# Patient Record
Sex: Female | Born: 1950 | Race: Black or African American | Hispanic: No | Marital: Single | Smoking: Current every day smoker
Health system: Southern US, Community
[De-identification: ages and names within clinical notes are randomized; demographics above are authoritative.]

## PROBLEM LIST (undated history)

## (undated) DIAGNOSIS — Z1630 Resistance to unspecified antimicrobial drugs: Secondary | ICD-10-CM

## (undated) DIAGNOSIS — T148XXA Other injury of unspecified body region, initial encounter: Secondary | ICD-10-CM

## (undated) DIAGNOSIS — F1210 Cannabis abuse, uncomplicated: Secondary | ICD-10-CM

## (undated) DIAGNOSIS — F1410 Cocaine abuse, uncomplicated: Secondary | ICD-10-CM

## (undated) DIAGNOSIS — I10 Essential (primary) hypertension: Secondary | ICD-10-CM

## (undated) DIAGNOSIS — I2510 Atherosclerotic heart disease of native coronary artery without angina pectoris: Secondary | ICD-10-CM

## (undated) DIAGNOSIS — I214 Non-ST elevation (NSTEMI) myocardial infarction: Secondary | ICD-10-CM

## (undated) HISTORY — DX: Atherosclerotic heart disease of native coronary artery without angina pectoris: I25.10

## (undated) HISTORY — DX: Resistance to unspecified antimicrobial drugs: Z16.30

## (undated) HISTORY — DX: Essential (primary) hypertension: I10

## (undated) HISTORY — DX: Other injury of unspecified body region, initial encounter: T14.8XXA

---

## 2011-06-19 DIAGNOSIS — I214 Non-ST elevation (NSTEMI) myocardial infarction: Secondary | ICD-10-CM

## 2011-06-19 HISTORY — DX: Non-ST elevation (NSTEMI) myocardial infarction: I21.4

## 2011-07-14 ENCOUNTER — Inpatient Hospital Stay (HOSPITAL_COMMUNITY)
Admission: AD | Admit: 2011-07-14 | Discharge: 2011-07-17 | DRG: 249 | Disposition: A | Discharge status: Home / Self Care | Payer: Medicaid - Out of State | Source: Other Acute Inpatient Hospital | Attending: Cardiology | Admitting: Cardiology

## 2011-07-14 ENCOUNTER — Encounter (HOSPITAL_COMMUNITY): Payer: Self-pay | Admitting: Nurse Practitioner

## 2011-07-14 DIAGNOSIS — F17200 Nicotine dependence, unspecified, uncomplicated: Secondary | ICD-10-CM | POA: Diagnosis present

## 2011-07-14 DIAGNOSIS — Z79899 Other long term (current) drug therapy: Secondary | ICD-10-CM

## 2011-07-14 DIAGNOSIS — I2510 Atherosclerotic heart disease of native coronary artery without angina pectoris: Secondary | ICD-10-CM | POA: Diagnosis present

## 2011-07-14 DIAGNOSIS — F1410 Cocaine abuse, uncomplicated: Secondary | ICD-10-CM | POA: Insufficient documentation

## 2011-07-14 DIAGNOSIS — Z833 Family history of diabetes mellitus: Secondary | ICD-10-CM

## 2011-07-14 DIAGNOSIS — Z7982 Long term (current) use of aspirin: Secondary | ICD-10-CM

## 2011-07-14 DIAGNOSIS — I10 Essential (primary) hypertension: Secondary | ICD-10-CM | POA: Diagnosis present

## 2011-07-14 DIAGNOSIS — F1210 Cannabis abuse, uncomplicated: Secondary | ICD-10-CM | POA: Insufficient documentation

## 2011-07-14 DIAGNOSIS — I214 Non-ST elevation (NSTEMI) myocardial infarction: Secondary | ICD-10-CM

## 2011-07-14 DIAGNOSIS — Z8249 Family history of ischemic heart disease and other diseases of the circulatory system: Secondary | ICD-10-CM

## 2011-07-14 DIAGNOSIS — Z7902 Long term (current) use of antithrombotics/antiplatelets: Secondary | ICD-10-CM

## 2011-07-14 HISTORY — DX: Non-ST elevation (NSTEMI) myocardial infarction: I21.4

## 2011-07-14 HISTORY — DX: Cocaine abuse, uncomplicated: F14.10

## 2011-07-14 HISTORY — DX: Cannabis abuse, uncomplicated: F12.10

## 2011-07-14 LAB — CARDIAC PANEL(CRET KIN+CKTOT+MB+TROPI)
CK, MB: 10.5 ng/mL (ref 0.3–4.0)
Troponin I: 2.61 ng/mL (ref <0.30)

## 2011-07-14 LAB — MRSA PCR SCREENING: MRSA by PCR: NEGATIVE

## 2011-07-14 MED ORDER — ALPRAZOLAM 0.25 MG PO TABS
0.2500 mg | ORAL_TABLET | Freq: Two times a day (BID) | ORAL | Status: DC | PRN
  Administered 2011-07-15 – 2011-07-16 (×2): 0.25 mg via ORAL
  Filled 2011-07-14 (×3): qty 1

## 2011-07-14 MED ORDER — NITROGLYCERIN IN D5W 200-5 MCG/ML-% IV SOLN
3.0000 ug/min | INTRAVENOUS | Status: DC
  Administered 2011-07-14: 30 ug/min via INTRAVENOUS
  Administered 2011-07-16: 5 ug/min via INTRAVENOUS
  Filled 2011-07-14: qty 250

## 2011-07-14 MED ORDER — HEPARIN SOD (PORCINE) IN D5W 100 UNIT/ML IV SOLN
950.0000 [IU]/h | INTRAVENOUS | Status: DC
  Administered 2011-07-14: 650 [IU]/h via INTRAVENOUS
  Administered 2011-07-15: 950 [IU]/h via INTRAVENOUS
  Filled 2011-07-14 (×3): qty 250

## 2011-07-14 MED ORDER — SODIUM CHLORIDE 0.9 % IV SOLN: 250.0000 mL | INTRAVENOUS | Status: DC | PRN

## 2011-07-14 MED ORDER — HEPARIN BOLUS VIA INFUSION
3000.0000 [IU] | Freq: Once | INTRAVENOUS | Status: AC
  Administered 2011-07-14: 3000 [IU] via INTRAVENOUS
  Filled 2011-07-14: qty 3000

## 2011-07-14 MED ORDER — SODIUM CHLORIDE 0.9 % IJ SOLN
3.0000 mL | Freq: Two times a day (BID) | INTRAMUSCULAR | Status: DC
  Administered 2011-07-14: 3 mL via INTRAVENOUS

## 2011-07-14 MED ORDER — ONDANSETRON HCL 4 MG/2ML IJ SOLN: 4.0000 mg | Freq: Four times a day (QID) | INTRAMUSCULAR | Status: DC | PRN

## 2011-07-14 MED ORDER — ROSUVASTATIN CALCIUM 40 MG PO TABS
40.0000 mg | ORAL_TABLET | Freq: Every day | ORAL | Status: DC
  Administered 2011-07-14 – 2011-07-16 (×3): 40 mg via ORAL
  Filled 2011-07-14 (×4): qty 1

## 2011-07-14 MED ORDER — ACETAMINOPHEN 325 MG PO TABS
650.0000 mg | ORAL_TABLET | ORAL | Status: DC | PRN
  Administered 2011-07-15 – 2011-07-17 (×3): 650 mg via ORAL
  Filled 2011-07-14 (×3): qty 2

## 2011-07-14 MED ORDER — NITROGLYCERIN 0.4 MG SL SUBL
0.4000 mg | SUBLINGUAL_TABLET | SUBLINGUAL | Status: DC | PRN
  Administered 2011-07-16 (×2): 0.4 mg via SUBLINGUAL
  Filled 2011-07-14: qty 25

## 2011-07-14 MED ORDER — ZOLPIDEM TARTRATE 5 MG PO TABS: 10.0000 mg | ORAL_TABLET | Freq: Every evening | ORAL | Status: DC | PRN

## 2011-07-14 MED ORDER — SODIUM CHLORIDE 0.9 % IJ SOLN: 3.0000 mL | INTRAMUSCULAR | Status: DC | PRN

## 2011-07-14 MED ORDER — LOSARTAN POTASSIUM 50 MG PO TABS
50.0000 mg | ORAL_TABLET | Freq: Every day | ORAL | Status: DC
  Administered 2011-07-14 – 2011-07-17 (×4): 50 mg via ORAL
  Filled 2011-07-14 (×4): qty 1

## 2011-07-14 MED ORDER — ASPIRIN EC 81 MG PO TBEC
81.0000 mg | DELAYED_RELEASE_TABLET | Freq: Every day | ORAL | Status: DC
  Administered 2011-07-15: 81 mg via ORAL
  Filled 2011-07-14: qty 1

## 2011-07-14 NOTE — H&P (Signed)
Patient ID: Melissa Mccall MRN: [id number], DOB/AGE: August 29, 1950   Admit date: 07/14/2011   Primary Physician: None Primary Cardiologist: New - will f/u in [place]  Pt. Profile:   A 61 year old female with prior history of untreated hypertension as well as polysubstance abuse who presents on transfer from [facility] hospital secondary to non-ST elevation MI.  Problem List: Past Medical History  Diagnosis Date  . NSTEMI (non-ST elevated myocardial infarction)     a.  07/14/2011  . HTN (hypertension)     a. untreated  . Cocaine abuse   . Tobacco abuse     a. 40 pack years currently smoking 1ppd  . Marijuana abuse     Past Surgical History  Procedure Date  . Cesarean section     x 2     Allergies: No Known Allergies  HPI:   61 year old female with the above problem list. She reports being diagnosed previously with hypertension as well as thyroid disease she's never been on medications for any prolonged period of time. In fact, she says she refused workup of her thyroid. As per social history below, she smokes one pack per day and frequently uses marijuana and cocaine. Although initially saying she is cocaine 1 week ago with further questioning she admitted it was at least in the past 48 hours.  Possibly 4 days ago patient began experiencing intermittent left-sided and substernal chest pressure and tightness occurring at rest associated with diaphoresis. Symptoms would occur every 10 minutes or so lasting 10 minutes and resolving spontaneously but recurring frequently. Despite these symptoms she's been able to sleep the past few nights. Further, symptoms did not prevent her from using additional cocaine. This morning, patient awoke at approximately 5 AM secondary to recurrent chest discomfort. This time pain lasted longer than usual and a friend drove her to [facility]. There, she was noted to be markedly hypertensive with pressures in the 200s and her ECG reflected LVH with  repolarization abnormalities. Troponin was evaluated and found to be elevated 0.71 with a CK-MB of 8. Urine drug screen was positive for benzodiazepines, marijuana, and cocaine. She was transferred to [facility] cone for the evaluation. She's currently stable without chest pain and being maintained on IV nitroglycerin. Blood pressure is now in the 130s to 160s.   Home Medications - *No home meds*  Currently ordered Hospital Meds  Medication Dose Route Frequency Provider Last Rate Last Dose  . 0.9 %  sodium chloride infusion  250 mL Intravenous PRN Ok Anis, NP      . acetaminophen (TYLENOL) tablet 650 mg  650 mg Oral Q4H PRN Ok Anis, NP      . ALPRAZolam Prudy Feeler) tablet 0.25 mg  0.25 mg Oral BID PRN Ok Anis, NP      . aspirin EC tablet 81 mg  81 mg Oral Daily Ok Anis, NP      . losartan (COZAAR) tablet 50 mg  50 mg Oral Daily Ok Anis, NP      . nitroGLYCERIN (NITROSTAT) SL tablet 0.4 mg  0.4 mg Sublingual Q5 min PRN Ok Anis, NP      . nitroGLYCERIN 0.2 mg/mL in dextrose 5 % infusion  3-30 mcg/min Intravenous Titrated Ok Anis, NP      . ondansetron [facility]) injection 4 mg  4 mg Intravenous Q6H PRN Ok Anis, NP      . rosuvastatin (CRESTOR) tablet 40 mg  40 mg Oral q1800 Ok Anis,  NP      . sodium chloride 0.9 % injection 3 mL  3 mL Intravenous Q12H Ok Anis, NP      . sodium chloride 0.9 % injection 3 mL  3 mL Intravenous PRN Ok Anis, NP      . zolpidem (AMBIEN) tablet 10 mg  10 mg Oral QHS PRN Ok Anis, NP        Family History  Problem Relation Age of Onset  . Hypertension Mother     died in 35's  . Diabetes Mother   . Hypertension Father     died in 16's  . Colon cancer Brother     died in 21's   History   Social History  . Marital Status: Single    Spouse Name: N/A    Number of Children: N/A  . Years of Education: N/A   Occupational  History  . Not on file.   Social History Main Topics  . Smoking status: Current Everyday Smoker -- 1.0 packs/day for 40 years    Types: Cigarettes  . Smokeless tobacco: Never Used  . Alcohol Use: Yes     40 oz beer/day  . Drug Use: Yes     marijuana at least weekly (last used 1 wk ago); says she's tried cocaine and has used several times in the last week - last ~ 2 days ago  . Sexually Active: Yes   Other Topics Concern  . Not on file   Social History Narrative   Lives in [place].  Doesn't work or exercise.       Review of Systems: General: negative for chills, fever,or weight changes. + diaph in setting of c/p Cardiovascular: +++ for chest pain as outlined above.  No dyspnea on exertion, edema, orthopnea, palpitations, paroxysmal nocturnal dyspnea. Dermatological: negative for rash Respiratory: negative for cough or wheezing Urologic: negative for hematuria Abdominal: negative for nausea, vomiting, diarrhea, bright red blood per rectum, melena, or hematemesis Neurologic: negative for visual changes, syncope, or dizziness All other systems reviewed and are otherwise negative except as noted above.  Physical Exam: Blood pressure 160/94, pulse 79, temperature 98.5 F (36.9 C), temperature source Oral, height 5\' 4"  (1.626 m), weight 120 lb 5.9 oz (54.6 kg), SpO2 100.00%.  General: Well developed, well nourished, in no acute distress. Head: Normocephalic, atraumatic, sclera non-icteric, no xanthomas, nares are without discharge.  Neck: Supple without bruits or JVD. Lungs:  Resp regular and unlabored, CTA. Heart: RRR no s3, s4, or murmurs. Abdomen: Soft, non-tender, non-distended, BS + x 4.  Msk:  Strength and tone appears normal for age. Extremities: No clubbing, cyanosis or edema. DP/PT/Radials 2+ and equal bilaterally. Neuro: Alert and oriented X 3. Moves all extremities spontaneously. Psych: Normal affect.   Labs:  Labs from [facility] on paper chart.   Radiology/Studies:  No results found.  EKG:  RSR, 81, biatrial enlargement, lvh w/ antlat twi.  ASSESSMENT AND PLAN:   1. Non-ST segment elevation myocardial infarction:  Patient presents with elevated cardiac enzymes in the setting of 4 days of intermittent chest discomfort and cocaine abuse. We'll continue to cycle cardiac markers. Will add aspirin and statin therapy along with heparin and IV nitroglycerin. We will avoid beta blockers in the setting of ongoing cocaine abuse. Plan diagnostic catheterization on Monday or sooner should she have recurrent or recalcitrant chest pain.  2. Hypertensive urgency:  Patient's blood pressure was markedly elevated on presentation to [facility]. With IV nitroglycerin this is much  improved. We'll continue his IV nitroglycerin in the setting of above and given significant LVH on ECG will also add an ARB. We are choosing an ARB as patient reports that she was previously on antihypertensive that caused a cough. We'll check a 2-D echocardiogram to evaluate LV function and LVH.  3. Polysubstance abuse:  The patient has a long history of polysubstance abuse including tobacco marijuana and cocaine. We did discuss station however she will require a more formal counseling. Also ask social work to see regarding her cocaine and marijuana use.  4.? History of thyroid disease: Check TSH.  Signed, Nicolasa Ducking, NP 07/14/2011, 5:20 PM  Patient seen with NP.  Agree with all aspects of history, physical, and plan.  Will most likely plan cath Monday.  Control BP.  Needs to stop substance abuse.  Khai Torbert [employer]

## 2011-07-14 NOTE — Progress Notes (Addendum)
ANTICOAGULATION CONSULT NOTE - Initial Consult  Pharmacy Consult:  Heparin Indication:  ACS/STEMI  Allergies no known allergies  Patient Measurements: Height: 5\' 4"  (162.6 cm) Weight: 120 lb 5.9 oz (54.6 kg) IBW/kg (Calculated) : 54.7  Heparin Dosing Weight: 55kg  Vital Signs: Temp: 98.5 F (36.9 C) (01/26 1700) Temp src: Oral (01/26 1700) BP: 160/94 mmHg (01/26 1700) Pulse Rate: 79  (01/26 1700)  Labs: No results found for this basename: HGB:2,HCT:3,PLT:3,APTT:3,LABPROT:3,INR:3,HEPARINUNFRC:3,CREATININE:3,CKTOTAL:3,CKMB:3,TROPONINI:3 in the last 72 hours CrCl is unknown because no creatinine reading has been taken.  Medical History: No past medical history on file.   Assessment: 61 YOF transferred from [facility] with ACS/STEMI and Pharmacy to manage heparin gtt.  Patient with limited PMH available, no H&P yet.  Labs from [facility]:  SCr 1.08, troponin 0.71, INR 0.9, hgb 14, plts 243.  Goal of Therapy:  HL = 0.3 - 0.7 units/mL   Plan:  - Heparin 3000 units IV bolus, then gtt at 650 units/hr. - Check 6 hr HL - Daily HL and CBC   Phillips Climes, PharmD 07/14/2011,5:15 PM

## 2011-07-15 LAB — CBC
Hemoglobin: 12.4 g/dL (ref 12.0–15.0)
MCH: 30.2 pg (ref 26.0–34.0)
MCV: 89.5 fL (ref 78.0–100.0)
RBC: 4.1 MIL/uL (ref 3.87–5.11)

## 2011-07-15 LAB — BASIC METABOLIC PANEL
CO2: 25 mEq/L (ref 19–32)
Calcium: 9.4 mg/dL (ref 8.4–10.5)
Chloride: 105 mEq/L (ref 96–112)
Creatinine, Ser: 1 mg/dL (ref 0.50–1.10)
Glucose, Bld: 122 mg/dL — ABNORMAL HIGH (ref 70–99)
Sodium: 141 mEq/L (ref 135–145)

## 2011-07-15 LAB — CARDIAC PANEL(CRET KIN+CKTOT+MB+TROPI)
CK, MB: 9.3 ng/mL (ref 0.3–4.0)
Total CK: 150 U/L (ref 7–177)

## 2011-07-15 LAB — LIPID PANEL
HDL: 67 mg/dL (ref >39)
Total CHOL/HDL Ratio: 2.8 RATIO
Triglycerides: 109 mg/dL (ref <150)

## 2011-07-15 LAB — HEPARIN LEVEL (UNFRACTIONATED): Heparin Unfractionated: 0.26 IU/mL — ABNORMAL LOW (ref 0.30–0.70)

## 2011-07-15 MED ORDER — SODIUM CHLORIDE 0.9 % IJ SOLN: 3.0000 mL | INTRAMUSCULAR | Status: DC | PRN

## 2011-07-15 MED ORDER — SODIUM CHLORIDE 0.9 % IV SOLN: 250.0000 mL | INTRAVENOUS | Status: DC | PRN

## 2011-07-15 MED ORDER — CLOPIDOGREL BISULFATE 75 MG PO TABS
75.0000 mg | ORAL_TABLET | Freq: Every day | ORAL | Status: DC
  Filled 2011-07-15: qty 1

## 2011-07-15 MED ORDER — AMLODIPINE BESYLATE 5 MG PO TABS
5.0000 mg | ORAL_TABLET | Freq: Every day | ORAL | Status: DC
  Administered 2011-07-15 – 2011-07-16 (×2): 5 mg via ORAL
  Filled 2011-07-15 (×3): qty 1

## 2011-07-15 MED ORDER — ASPIRIN EC 81 MG PO TBEC
81.0000 mg | DELAYED_RELEASE_TABLET | Freq: Every day | ORAL | Status: DC
  Administered 2011-07-17: 81 mg via ORAL
  Filled 2011-07-15: qty 1

## 2011-07-15 MED ORDER — SODIUM CHLORIDE 0.9 % IV SOLN: 1.0000 mL/kg/h | INTRAVENOUS | Status: DC

## 2011-07-15 MED ORDER — SODIUM CHLORIDE 0.9 % IJ SOLN
3.0000 mL | Freq: Two times a day (BID) | INTRAMUSCULAR | Status: DC
  Administered 2011-07-15 – 2011-07-17 (×5): 3 mL via INTRAVENOUS

## 2011-07-15 MED ORDER — CLOPIDOGREL BISULFATE 75 MG PO TABS
75.0000 mg | ORAL_TABLET | ORAL | Status: AC
  Administered 2011-07-16: 75 mg via ORAL

## 2011-07-15 MED ORDER — SODIUM CHLORIDE 0.9 % IV SOLN
1.0000 mL/kg/h | INTRAVENOUS | Status: DC
  Administered 2011-07-16: 1 mL/kg/h via INTRAVENOUS

## 2011-07-15 MED ORDER — ASPIRIN 81 MG PO CHEW
324.0000 mg | CHEWABLE_TABLET | ORAL | Status: AC
  Administered 2011-07-16: 324 mg via ORAL
  Filled 2011-07-15: qty 4

## 2011-07-15 MED ORDER — ASPIRIN 81 MG PO CHEW: 324.0000 mg | CHEWABLE_TABLET | ORAL | Status: DC

## 2011-07-15 MED ORDER — CLOPIDOGREL BISULFATE 300 MG PO TABS
600.0000 mg | ORAL_TABLET | Freq: Once | ORAL | Status: AC
  Administered 2011-07-15: 600 mg via ORAL
  Filled 2011-07-15: qty 2

## 2011-07-15 MED ORDER — SODIUM CHLORIDE 0.9 % IJ SOLN: 3.0000 mL | Freq: Two times a day (BID) | INTRAMUSCULAR | Status: DC

## 2011-07-15 MED ORDER — SODIUM CHLORIDE 0.9 % IV SOLN: INTRAVENOUS | Status: DC

## 2011-07-15 MED ORDER — HEPARIN BOLUS VIA INFUSION
1500.0000 [IU] | Freq: Once | INTRAVENOUS | Status: AC
  Administered 2011-07-15: 1500 [IU] via INTRAVENOUS
  Filled 2011-07-15: qty 1500

## 2011-07-15 NOTE — Progress Notes (Signed)
Patient ID: Melissa Mccall, female   DOB: 06-04-1951, 61 y.o.   MRN: [id number]    SUBJECTIVE: No further chest pain.  BP controlled.  Cardiac enzymes elevated.      Marland Kitchen amLODipine  5 mg Oral Daily  . aspirin  324 mg Oral Pre-Cath  . aspirin EC  81 mg Oral Daily  . clopidogrel  600 mg Oral Once  . clopidogrel  75 mg Oral Q breakfast  . heparin  1,500 Units Intravenous Once  . heparin  3,000 Units Intravenous Once  . losartan  50 mg Oral Daily  . rosuvastatin  40 mg Oral q1800  . sodium chloride  3 mL Intravenous Q12H  . sodium chloride  3 mL Intravenous Q12H  . sodium chloride  3 mL Intravenous Q12H  heparin gtt NTG gtt at 10    Filed Vitals:   07/15/11 0500 07/15/11 0600 07/15/11 0630 07/15/11 0810  BP: 99/54 118/77 124/70   Pulse: 65 65 66   Temp:    97.7 F (36.5 C)  TempSrc:    Oral  Resp: 15 18 17    Height:      Weight:      SpO2: 99% 99% 99%     Intake/Output Summary (Last 24 hours) at 07/15/11 0929 Last data filed at 07/15/11 0700  Gross per 24 hour  Intake  578.5 ml  Output    600 ml  Net  -21.5 ml    LABS: Basic Metabolic Panel:  Basename 07/15/11 0525  NA 141  K 3.7  CL 105  CO2 25  GLUCOSE 122*  BUN 22  CREATININE 1.00  CALCIUM 9.4  MG --  PHOS --   Liver Function Tests: No results found for this basename: AST:2,ALT:2,ALKPHOS:2,BILITOT:2,PROT:2,ALBUMIN:2 in the last 72 hours No results found for this basename: LIPASE:2,AMYLASE:2 in the last 72 hours CBC:  Basename 07/15/11 0525  WBC 12.4*  NEUTROABS --  HGB 12.4  HCT 36.7  MCV 89.5  PLT 225   Cardiac Enzymes:  Basename 07/15/11 0036 07/14/11 1809  CKTOTAL 150 154  CKMB 9.3* 10.5*  CKMBINDEX -- --  TROPONINI 3.37* 2.61*   BNP: No components found with this basename: POCBNP:3 D-Dimer: No results found for this basename: DDIMER:2 in the last 72 hours Hemoglobin A1C:  Basename 07/14/11 1808  HGBA1C 5.8*   Fasting Lipid Panel:  Basename 07/15/11 0525  CHOL 189  HDL 67    LDLCALC 100*  TRIG 109  CHOLHDL 2.8  LDLDIRECT --   Thyroid Function Tests:  Basename 07/14/11 1808  TSH 1.306  T4TOTAL --  T3FREE --  THYROIDAB --   Anemia Panel: No results found for this basename: VITAMINB12,FOLATE,FERRITIN,TIBC,IRON,RETICCTPCT in the last 72 hours  RADIOLOGY: No results found.  PHYSICAL EXAM General: NAD Neck: No JVD, no thyromegaly or thyroid nodule.  Lungs: Clear to auscultation bilaterally with normal respiratory effort. CV: Nondisplaced PMI.  Heart regular S1/S2, +S4, no murmur.  No peripheral edema.  No carotid bruit.  Normal pedal pulses.  Abdomen: Soft, nontender, no hepatosplenomegaly, no distention.  Neurologic: Alert and oriented x 3.  Psych: Normal affect. Extremities: No clubbing or cyanosis.   TELEMETRY: Reviewed telemetry pt in NSR  ASSESSMENT AND PLAN:  61 yo with uncontrolled HTN presented with chest pain and hypertensive emergency, found to have significantly elevated cardiac enzymes.  She has been using cocaine.  1. NSTEMI: Significantly elevated cardiac enzymes with chest pain in the setting of hypertensive emergency and cocaine abuse.  May be demand ischemic  from severely elevated BP, but cannot rule out true ACS with plaque rupture, especially with degree of cardiac enzyme rise.  - ASA, Plavix, heparin gtt - No beta blocker with recent cocaine use.  2. HTN: BP under better control.  Continue losartan, add amlodipine and titrate off NTG gtt. 3. Counseled re: multiple substances (tobacco, cocaine, marijuana).   Marca Ancona 07/15/2011 9:37 AM

## 2011-07-15 NOTE — Progress Notes (Signed)
ANTICOAGULATION CONSULT NOTE - Follow Up  Pharmacy Consult:  Heparin Indication:  NSTEMI  No Known Allergies  Patient Measurements: Height: 5\' 4"  (162.6 cm) Weight: 120 lb 5.9 oz (54.6 kg) IBW/kg (Calculated) : 54.7  Heparin Dosing Weight: 55kg  Vital Signs: Temp: 98.3 F (36.8 C) (01/27 0000) Temp src: Oral (01/27 0000) BP: 103/66 mmHg (01/27 0000) Pulse Rate: 72  (01/27 0000)  Labs:  Basename 07/15/11 0037 07/15/11 0036 07/14/11 1809  HGB -- -- --  HCT -- -- --  PLT -- -- --  APTT -- -- --  LABPROT -- -- --  INR -- -- --  HEPARINUNFRC 0.14* -- --  CREATININE -- -- --  CKTOTAL -- 150 154  CKMB -- 9.3* 10.5*  TROPONINI -- 3.37* 2.61*   CrCl is unknown because no creatinine reading has been taken.  Medical History: Past Medical History  Diagnosis Date  . NSTEMI (non-ST elevated myocardial infarction)     a.  07/14/2011  . HTN (hypertension)     a. untreated  . Cocaine abuse   . Tobacco abuse     a. 40 pack years currently smoking 1ppd  . Marijuana abuse      Assessment: 61 yo female transferred from [facility] with NSTEMI.  Heparin level (0.14) is below-goal on heparin infusion at 650 units/hr. No problem with line per RN. Plan for possible cath on Monday.   Goal of Therapy:  HL = 0.3 - 0.7 units/mL   Plan:  1. Heparin IV bolus of 1500 units x 1, then increase IV infusion to 850 units/hr.  2. Heparin level in 6 hours.   07/15/2011,2:00 AM

## 2011-07-15 NOTE — Progress Notes (Signed)
ANTICOAGULATION CONSULT NOTE - Follow Up  Pharmacy Consult:  Heparin Indication:  NSTEMI  No Known Allergies  Patient Measurements: Height: 5\' 4"  (162.6 cm) Weight: 119 lb 4.3 oz (54.1 kg) IBW/kg (Calculated) : 54.7  Heparin Dosing Weight: 55kg  Vital Signs: Temp: 97.7 F (36.5 C) (01/27 0810) Temp src: Oral (01/27 0810) BP: 124/70 mmHg (01/27 0630) Pulse Rate: 66  (01/27 0630)  Labs:  Alvira Philips 07/15/11 1610 07/15/11 0525 07/15/11 0037 07/15/11 0036 07/14/11 1809  HGB -- 12.4 -- -- --  HCT -- 36.7 -- -- --  PLT -- 225 -- -- --  APTT -- -- -- -- --  LABPROT -- -- -- -- --  INR -- -- -- -- --  HEPARINUNFRC 0.26* -- 0.14* -- --  CREATININE -- 1.00 -- -- --  CKTOTAL -- -- -- 150 154  CKMB -- -- -- 9.3* 10.5*  TROPONINI -- -- -- 3.37* 2.61*   Estimated Creatinine Clearance: 51.1 ml/min (by C-G formula based on Cr of 1).  Medical History: Past Medical History  Diagnosis Date  . NSTEMI (non-ST elevated myocardial infarction)     a.  07/14/2011  . HTN (hypertension)     a. untreated  . Cocaine abuse   . Tobacco abuse     a. 40 pack years currently smoking 1ppd  . Marijuana abuse      Assessment: 61 yo female transferred from [facility] with NSTEMI.  Heparin level (0.26) is below-goal but increasing on heparin infusion at 850 units/hr. No problem with line per RN. Plan for possible cath on Monday.   Goal of Therapy:  HL = 0.3 - 0.7 units/mL   Plan:  1. Increase IV infusion to 950 units/hr.  2. Heparin level and CBC in AM.    Janace Litten, PharmD [phone] 07/15/2011,9:53 AM

## 2011-07-16 ENCOUNTER — Encounter (HOSPITAL_COMMUNITY)
Admission: AD | Disposition: A | Discharge status: Home / Self Care | Payer: Self-pay | Source: Other Acute Inpatient Hospital | Attending: Cardiology

## 2011-07-16 ENCOUNTER — Other Ambulatory Visit: Payer: Self-pay

## 2011-07-16 DIAGNOSIS — F1210 Cannabis abuse, uncomplicated: Secondary | ICD-10-CM | POA: Insufficient documentation

## 2011-07-16 DIAGNOSIS — F1410 Cocaine abuse, uncomplicated: Secondary | ICD-10-CM | POA: Insufficient documentation

## 2011-07-16 DIAGNOSIS — I214 Non-ST elevation (NSTEMI) myocardial infarction: Secondary | ICD-10-CM

## 2011-07-16 DIAGNOSIS — I2510 Atherosclerotic heart disease of native coronary artery without angina pectoris: Secondary | ICD-10-CM

## 2011-07-16 DIAGNOSIS — I517 Cardiomegaly: Secondary | ICD-10-CM

## 2011-07-16 HISTORY — PX: PERCUTANEOUS CORONARY STENT INTERVENTION (PCI-S): SHX5485

## 2011-07-16 HISTORY — PX: LEFT HEART CATHETERIZATION WITH CORONARY ANGIOGRAM: SHX5451

## 2011-07-16 LAB — CBC
MCV: 89.5 fL (ref 78.0–100.0)
Platelets: 220 10*3/uL (ref 150–400)
RBC: 4.21 MIL/uL (ref 3.87–5.11)
RDW: 14 % (ref 11.5–15.5)
WBC: 8.8 10*3/uL (ref 4.0–10.5)

## 2011-07-16 LAB — BASIC METABOLIC PANEL
CO2: 25 mEq/L (ref 19–32)
Calcium: 9.4 mg/dL (ref 8.4–10.5)
Creatinine, Ser: 0.81 mg/dL (ref 0.50–1.10)
GFR calc non Af Amer: 77 mL/min — ABNORMAL LOW (ref >90)
Sodium: 138 mEq/L (ref 135–145)

## 2011-07-16 LAB — PROTIME-INR: Prothrombin Time: 13.8 seconds (ref 11.6–15.2)

## 2011-07-16 SURGERY — LEFT HEART CATH
Anesthesia: LOCAL

## 2011-07-16 SURGERY — LEFT HEART CATHETERIZATION WITH CORONARY ANGIOGRAM
Anesthesia: LOCAL

## 2011-07-16 MED ORDER — NITROGLYCERIN 2 % TD OINT
1.0000 [in_us] | TOPICAL_OINTMENT | Freq: Four times a day (QID) | TRANSDERMAL | Status: DC
  Administered 2011-07-16 – 2011-07-17 (×3): 1 [in_us] via TOPICAL
  Filled 2011-07-16: qty 30

## 2011-07-16 MED ORDER — LIDOCAINE HCL (PF) 1 % IJ SOLN
INTRAMUSCULAR | Status: AC
  Filled 2011-07-16: qty 30

## 2011-07-16 MED ORDER — VERAPAMIL HCL 2.5 MG/ML IV SOLN
INTRAVENOUS | Status: AC
  Filled 2011-07-16: qty 2

## 2011-07-16 MED ORDER — HEPARIN SODIUM (PORCINE) 1000 UNIT/ML IJ SOLN
INTRAMUSCULAR | Status: AC
  Filled 2011-07-16: qty 1

## 2011-07-16 MED ORDER — BIVALIRUDIN 250 MG IV SOLR
INTRAVENOUS | Status: AC
  Filled 2011-07-16: qty 250

## 2011-07-16 MED ORDER — SODIUM CHLORIDE 0.9 % IV SOLN: INTRAVENOUS | Status: AC

## 2011-07-16 MED ORDER — MIDAZOLAM HCL 2 MG/2ML IJ SOLN
INTRAMUSCULAR | Status: AC
  Filled 2011-07-16: qty 2

## 2011-07-16 MED ORDER — FENTANYL CITRATE 0.05 MG/ML IJ SOLN
INTRAMUSCULAR | Status: AC
  Filled 2011-07-16: qty 2

## 2011-07-16 MED ORDER — NITROGLYCERIN 0.2 MG/ML ON CALL CATH LAB
INTRAVENOUS | Status: AC
  Filled 2011-07-16: qty 1

## 2011-07-16 MED ORDER — HEPARIN (PORCINE) IN NACL 2-0.9 UNIT/ML-% IJ SOLN
INTRAMUSCULAR | Status: AC
  Filled 2011-07-16: qty 2000

## 2011-07-16 MED FILL — Nitroglycerin IV Soln 200 MCG/ML in D5W: INTRAVENOUS | Qty: 250 | Status: AC

## 2011-07-16 NOTE — H&P (View-Only) (Signed)
Patient Name: Melissa Mccall Date of Encounter: 07/16/2011     Principal Problem:  *NSTEMI (non-ST elevated myocardial infarction) Active Problems:  HTN (hypertension)  Cocaine abuse  Tobacco abuse  Marijuana abuse    SUBJECTIVE:  Had nitrate responsive chest pain this am.  ECG unchanged.  Currently pain free.  On for cath today.   CURRENT MEDS    . amLODipine  5 mg Oral Daily  . aspirin  324 mg Oral Pre-Cath  . aspirin EC  81 mg Oral Daily  . clopidogrel  600 mg Oral Once  . clopidogrel  75 mg Oral Pre-Cath  . clopidogrel  75 mg Oral Q breakfast  . losartan  50 mg Oral Daily  . rosuvastatin  40 mg Oral q1800    OBJECTIVE  Filed Vitals:   07/16/11 0604 07/16/11 0610 07/16/11 0615 07/16/11 0630  BP: 132/81 132/79 132/79   Pulse: 74 87 85 71  Temp:      TempSrc:      Resp: 21 20 20 17   Height:      Weight:      SpO2: 98% 97% 95% 99%    Intake/Output Summary (Last 24 hours) at 07/16/11 0706 Last data filed at 07/16/11 0600  Gross per 24 hour  Intake 1368.18 ml  Output   2225 ml  Net -856.82 ml    PHYSICAL EXAM  General: Well developed, well nourished, in no acute distress. Head: Normocephalic, atraumatic, sclera non-icteric, no xanthomas, nares are without discharge.  Neck: Supple without bruits or JVD. Lungs:  Resp regular and unlabored, CTA. Heart: RRR no s3, s4, or murmurs. Abdomen: Soft, non-tender, non-distended, BS + x 4.  Msk:  Strength and tone appears normal for age. Extremities: No clubbing, cyanosis or edema. DP/PT/Radials 2+ and equal bilaterally. Neuro: Alert and oriented X 3. Moves all extremities spontaneously. Psych: Normal affect.  LABS:  CBC:  Basename 07/15/11 0525  WBC 12.4*  NEUTROABS --  HGB 12.4  HCT 36.7  MCV 89.5  PLT 225   Basic Metabolic Panel:  Basename 07/15/11 0525  NA 141  K 3.7  CL 105  CO2 25  GLUCOSE 122*  BUN 22  CREATININE 1.00  CALCIUM 9.4  MG --  PHOS --   Cardiac Enzymes:  Basename  07/15/11 0036 07/14/11 1809  CKTOTAL 150 154  CKMB 9.3* 10.5*  CKMBINDEX -- --  TROPONINI 3.37* 2.61*  Hemoglobin A1C:  Basename 07/14/11 1808  HGBA1C 5.8*   Fasting Lipid Panel:  Basename 07/15/11 0525  CHOL 189  HDL 67  LDLCALC 100*  TRIG 109  CHOLHDL 2.8  LDLDIRECT --   Thyroid Function Tests:  Basename 07/14/11 1808  TSH 1.306  T4TOTAL --  T3FREE --  THYROIDAB --     TELE  RSR  ECG  RSR, lvh with diffuse twi inversion - unchanged   Radiology/Studies:  No results found.  ASSESSMENT AND PLAN:  1.  NSTEMI:  In setting of recent cocaine abuse.  Had recurrent, nitrate-responsive, chest pain @ rest this am.  Currently pain free.  On board for cath today.  Cont asa, plavix, statin, ccb.  Add NTP while we await cath.  No bb 2/2 recent cocaine usage.  Eventual cardiac rehab.  2.  Polysubstance Abuse:  Cessation advised.  SW & tobacco cessation consults pending.  3.  HTN:  Much improved compared to admission.  Cont arb/ccb.  4.  HL:  LDL = 100.  Cont high-dose statin.  5.  Reported h/o  thyroid disease:  TSH is NL.   Signed, Nicolasa Ducking NP   I have personally seen and examined this patient with Ward Givens, NP. I agree with the assessment and plan as outlined above. Plans for cardiac cath today.   [name],[name] 2:27 PM 07/16/2011

## 2011-07-16 NOTE — Progress Notes (Signed)
UR Completed. Simmons, Jenasia Dolinar F [phone]  

## 2011-07-16 NOTE — Progress Notes (Signed)
ANTICOAGULATION CONSULT NOTE - Follow Up  Pharmacy Consult:  Heparin Indication:  NSTEMI  No Known Allergies  Patient Measurements: Height: 5\' 4"  (162.6 cm) Weight: 123 lb 0.3 oz (55.8 kg) IBW/kg (Calculated) : 54.7  Heparin Dosing Weight: 55kg  Vital Signs: Temp: 98.2 F (36.8 C) (01/28 0730) Temp src: Oral (01/28 0730) BP: 190/122 mmHg (01/28 0730) Pulse Rate: 73  (01/28 0730)  Labs:  Alvira Philips 07/16/11 0645 07/15/11 0852 07/15/11 0525 07/15/11 0037 07/15/11 0036 07/14/11 1809  HGB 12.6 -- 12.4 -- -- --  HCT 37.7 -- 36.7 -- -- --  PLT 220 -- 225 -- -- --  APTT -- -- -- -- -- --  LABPROT 13.8 -- -- -- -- --  INR 1.04 -- -- -- -- --  HEPARINUNFRC 0.29* 0.26* -- 0.14* -- --  CREATININE 0.81 -- 1.00 -- -- --  CKTOTAL -- -- -- -- 150 154  CKMB -- -- -- -- 9.3* 10.5*  TROPONINI -- -- -- -- 3.37* 2.61*   Estimated Creatinine Clearance: 63.8 ml/min (by C-G formula based on Cr of 0.81).    Assessment: 61 yo female transferred from [facility] with NSTEMI.  Heparin level (0.29) is just below  at 850 units/hr. Plan for cath today   Goal of Therapy:  HL = 0.3 - 0.7 units/mL   Plan:  - No heparin changes as patient for cath and at edge of goal - Will follow post procedure  Harland German, Pharm D 07/16/2011 9:34 AM

## 2011-07-16 NOTE — Interval H&P Note (Signed)
History and Physical Interval Note:  07/16/2011 2:28 PM  Melissa Mccall  has presented today for cardiac cath  with the diagnosis of NSTEMI  The various methods of treatment have been discussed with the patient and family. After consideration of risks, benefits and other options for treatment, the patient has consented to  Procedure(s): LEFT HEART CATHETERIZATION WITH CORONARY ANGIOGRAM as a surgical intervention .  The patients' history has been reviewed, patient examined, no change in status, stable for surgery.  I have reviewed the patients' chart and labs.  Questions were answered to the patient's satisfaction.     [name],[name]

## 2011-07-16 NOTE — Progress Notes (Signed)
0600 pt complaining of 8/10 CP, EKG obtained and SL NTG given x2, with CP relief.  Ward Givens, NP.  Called and aware, VSS

## 2011-07-16 NOTE — Consult Note (Signed)
Pt smokes 1 ppd and is in action stage eager to quit. She wants to try quitting cold [place] first and says if unable to do it on her own she will use patches. Pt to call me back and give me f/u as to how she's doing. Encouraged pt for quitting. Referred to 1-800 quit now for f/u and support. Discussed oral fixation substitutes, second hand smoke and in home smoking policy. Reviewed and gave pt Written education/contact information.

## 2011-07-16 NOTE — Op Note (Signed)
Cardiac Catheterization Operative Report  Melissa Mccall [id number] 1/28/20133:19 PM No primary provider on file.  Procedure Performed:  1. Left Heart Catheterization 2. Selective Coronary Angiography 3. PTCA/bare metal stent x 1 proximal LAD  Operator: Verne Carrow, MD  Arterial access site:  Right radial artery.   Indication:  NSTEMI                                     Procedure Details: The risks, benefits, complications, treatment options, and expected outcomes were discussed with the patient. The patient and/or family concurred with the proposed plan, giving informed consent. The patient was brought to the cath lab after IV hydration was begun and oral premedication was given. The patient was further sedated with Versed and Fentanyl. The right wrist was assessed with an Allens test which was positive. The right wrist was prepped and draped in a sterile fashion. 1% lidocaine was used for local anesthesia. Using the modified Seldinger access technique, a 5 French sheath was placed in the right radial artery. 3 mg Verapamil was given through the sheath. 3500 units IV heparin was given. Standard diagnostic catheters were used to perform selective coronary angiography. LV pressures were measured with the JR 4 catheter.   At this point, I elected to proceed to intervention of the proximal LAD stenosis. The sheath was upsized to a 6 [place] sheath. The patient was given a bolus of Angiomax and a drip was started. I initially attempted to advance a 6 [place] guide but could not pass it secondary to spasm in the right radial artery. I then gave several rounds of intra-arterial NTG through the sheath and another 3 mg of Verapamil through the sheath. A 5 [place] EBU guide was used to engage the left main. When the ACT was greater than 200, I advanced a Cougar IC wire down the LAD. A 2.5 x 12 mm balloon was used to pre-dilate the stenosis. A 3.0 x 15 mm Vision bare metal stent was deployed in the   Proximal LAD. A 3.25 x 12 mm Marne balloon was used to post-dilate the stent. There was an excellent angiographic result with excellent flow down the vessel. She had been loaded with Plavix 600 mg x 1 yesterday.   The sheath was removed from the right radial artery and a Terumo hemostasis band was applied at the arteriotomy site on the right wrist. Angiomax was stopped in the cath lab.  There were no immediate complications. The patient was taken to the recovery area in stable condition.   Hemodynamic Findings: Central aortic pressure:162/92 Left ventricular pressure: 164/7/12  Angiographic Findings:  Left main: No evidence of disease.   Left Anterior Descending Artery: Large vessel that courses to the apex. There is a moderate sized diagonal branch with no disease. The proximal LAD has a 95% hazy stenosis. This is likely the culprit vessel.   Circumflex Artery: Moderate sized vessel with no obstructive disease noted. Moderate sized ramus intermediate branch with 30% smooth stenosis.   Right Coronary Artery: Moderate sized, dominant vessel with 3-% proximal stenosis.   Left Ventricular Angiogram: Not performed.   Impression: 1. Single vessel CAD 2. NSTEMI secondary to severe, hazy stenosis in proximal LAD likely related to ulcerated plaque.  3. Successful placement bare metal stent x 1 proximal LAD.  4. Cocaine abuse. Bare metal stent placed given question of medical compliance and need for dual anti-platelet therapy.  Recommendations: ASA and Plavix for one year. P2Y12 testing in am. Beta blocker and statin.        Complications:  None. The patient tolerated the procedure well.

## 2011-07-16 NOTE — Progress Notes (Addendum)
 Patient Name: Melissa Mccall Date of Encounter: 07/16/2011     Principal Problem:  *NSTEMI (non-ST elevated myocardial infarction) Active Problems:  HTN (hypertension)  Cocaine abuse  Tobacco abuse  Marijuana abuse    SUBJECTIVE:  Had nitrate responsive chest pain this am.  ECG unchanged.  Currently pain free.  On for cath today.   CURRENT MEDS    . amLODipine  5 mg Oral Daily  . aspirin  324 mg Oral Pre-Cath  . aspirin EC  81 mg Oral Daily  . clopidogrel  600 mg Oral Once  . clopidogrel  75 mg Oral Pre-Cath  . clopidogrel  75 mg Oral Q breakfast  . losartan  50 mg Oral Daily  . rosuvastatin  40 mg Oral q1800    OBJECTIVE  Filed Vitals:   07/16/11 0604 07/16/11 0610 07/16/11 0615 07/16/11 0630  BP: 132/81 132/79 132/79   Pulse: 74 87 85 71  Temp:      TempSrc:      Resp: 21 20 20 17  Height:      Weight:      SpO2: 98% 97% 95% 99%    Intake/Output Summary (Last 24 hours) at 07/16/11 0706 Last data filed at 07/16/11 0600  Gross per 24 hour  Intake 1368.18 ml  Output   2225 ml  Net -856.82 ml    PHYSICAL EXAM  General: Well developed, well nourished, in no acute distress. Head: Normocephalic, atraumatic, sclera non-icteric, no xanthomas, nares are without discharge.  Neck: Supple without bruits or JVD. Lungs:  Resp regular and unlabored, CTA. Heart: RRR no s3, s4, or murmurs. Abdomen: Soft, non-tender, non-distended, BS + x 4.  Msk:  Strength and tone appears normal for age. Extremities: No clubbing, cyanosis or edema. DP/PT/Radials 2+ and equal bilaterally. Neuro: Alert and oriented X 3. Moves all extremities spontaneously. Psych: Normal affect.  LABS:  CBC:  Basename 07/15/11 0525  WBC 12.4*  NEUTROABS --  HGB 12.4  HCT 36.7  MCV 89.5  PLT 225   Basic Metabolic Panel:  Basename 07/15/11 0525  NA 141  K 3.7  CL 105  CO2 25  GLUCOSE 122*  BUN 22  CREATININE 1.00  CALCIUM 9.4  MG --  PHOS --   Cardiac Enzymes:  Basename  07/15/11 0036 07/14/11 1809  CKTOTAL 150 154  CKMB 9.3* 10.5*  CKMBINDEX -- --  TROPONINI 3.37* 2.61*  Hemoglobin A1C:  Basename 07/14/11 1808  HGBA1C 5.8*   Fasting Lipid Panel:  Basename 07/15/11 0525  CHOL 189  HDL 67  LDLCALC 100*  TRIG 109  CHOLHDL 2.8  LDLDIRECT --   Thyroid Function Tests:  Basename 07/14/11 1808  TSH 1.306  T4TOTAL --  T3FREE --  THYROIDAB --     TELE  RSR  ECG  RSR, lvh with diffuse twi inversion - unchanged   Radiology/Studies:  No results found.  ASSESSMENT AND PLAN:  1.  NSTEMI:  In setting of recent cocaine abuse.  Had recurrent, nitrate-responsive, chest pain @ rest this am.  Currently pain free.  On board for cath today.  Cont asa, plavix, statin, ccb.  Add NTP while we await cath.  No bb 2/2 recent cocaine usage.  Eventual cardiac rehab.  2.  Polysubstance Abuse:  Cessation advised.  SW & tobacco cessation consults pending.  3.  HTN:  Much improved compared to admission.  Cont arb/ccb.  4.  HL:  LDL = 100.  Cont high-dose statin.  5.  Reported h/o   thyroid disease:  TSH is NL.   Signed, Jd Mccaster Berge NP   I have personally seen and examined this patient with Chris Berge, NP. I agree with the assessment and plan as outlined above. Plans for cardiac cath today.   Chyan Carnero 2:27 PM 07/16/2011  

## 2011-07-16 NOTE — Progress Notes (Signed)
TR BAND REMOVAL  LOCATION:  right radial  DEFLATED PER PROTOCOL:  yes  TIME BAND OFF / DRESSING APPLIED:   1845   SITE UPON ARRIVAL:   Level 0  SITE AFTER BAND REMOVAL:  Level 0  REVERSE ALLEN'S TEST:    positive  CIRCULATION SENSATION AND MOVEMENT:  Within Normal Limits  yes  COMMENTS:    

## 2011-07-17 ENCOUNTER — Other Ambulatory Visit: Payer: Self-pay

## 2011-07-17 LAB — COMPREHENSIVE METABOLIC PANEL
BUN: 11 mg/dL (ref 6–23)
CO2: 25 mEq/L (ref 19–32)
Calcium: 9.8 mg/dL (ref 8.4–10.5)
Creatinine, Ser: 0.8 mg/dL (ref 0.50–1.10)
GFR calc Af Amer: >90 mL/min (ref >90)
GFR calc non Af Amer: 79 mL/min — ABNORMAL LOW (ref >90)
Glucose, Bld: 110 mg/dL — ABNORMAL HIGH (ref 70–99)

## 2011-07-17 LAB — CBC
HCT: 37.7 % (ref 36.0–46.0)
Hemoglobin: 12.6 g/dL (ref 12.0–15.0)
MCH: 30.1 pg (ref 26.0–34.0)
MCHC: 33.4 g/dL (ref 30.0–36.0)

## 2011-07-17 LAB — POCT ACTIVATED CLOTTING TIME: Activated Clotting Time: 644 seconds

## 2011-07-17 MED ORDER — PRASUGREL HCL 10 MG PO TABS
10.0000 mg | ORAL_TABLET | Freq: Every day | ORAL | Status: DC
  Filled 2011-07-17: qty 1

## 2011-07-17 MED ORDER — AMLODIPINE BESYLATE 10 MG PO TABS
10.0000 mg | ORAL_TABLET | Freq: Every day | ORAL | Status: DC
  Administered 2011-07-17: 10 mg via ORAL
  Filled 2011-07-17: qty 1

## 2011-07-17 MED ORDER — LOSARTAN POTASSIUM 50 MG PO TABS: 50.0000 mg | ORAL_TABLET | Freq: Every day | ORAL | Status: DC

## 2011-07-17 MED ORDER — NITROGLYCERIN 0.4 MG SL SUBL: 0.4000 mg | SUBLINGUAL_TABLET | SUBLINGUAL | Status: DC | PRN

## 2011-07-17 MED ORDER — PRAVASTATIN SODIUM 80 MG PO TABS: 80.0000 mg | ORAL_TABLET | Freq: Every day | ORAL | Status: DC

## 2011-07-17 MED ORDER — AMLODIPINE BESYLATE 10 MG PO TABS: 10.0000 mg | ORAL_TABLET | Freq: Every day | ORAL | Status: DC

## 2011-07-17 MED ORDER — PRASUGREL HCL 10 MG PO TABS
60.0000 mg | ORAL_TABLET | Freq: Once | ORAL | Status: DC
  Filled 2011-07-17: qty 6

## 2011-07-17 MED ORDER — PRASUGREL HCL 10 MG PO TABS: 10.0000 mg | ORAL_TABLET | Freq: Every day | ORAL | Status: DC

## 2011-07-17 MED ORDER — PRASUGREL HCL 10 MG PO TABS
60.0000 mg | ORAL_TABLET | Freq: Once | ORAL | Status: AC
  Administered 2011-07-17: 60 mg via ORAL
  Filled 2011-07-17: qty 6

## 2011-07-17 MED ORDER — ASPIRIN 81 MG PO TBEC: 81.0000 mg | DELAYED_RELEASE_TABLET | Freq: Every day | ORAL | Status: AC

## 2011-07-17 MED FILL — Dextrose Inj 5%: INTRAVENOUS | Qty: 50 | Status: AC

## 2011-07-17 NOTE — Discharge Summary (Signed)
Full note written this am. cdm 

## 2011-07-17 NOTE — Discharge Summary (Signed)
Discharge Summary   Patient ID: Melissa Mccall MRN: [id number], DOB/AGE: March 28, 1951 61 y.o.  Primary MD: None Primary Cardiologist: New to [place] Admit date: 07/14/2011 D/C date:     07/17/2011      Primary Discharge Diagnoses:  1. NSTEMI  - s/p cardiac cath 07/16/11 w/ severe prox LAD stenosis now s/p BMS x1 prox LAD  - Plavix resistance by PRU of 311. Changed to Effient   - EF 60% by echo 07/16/11  2. Cocaine/Marijuana abuse  3. Uncontrolled Hypertension   Secondary Discharge Diagnoses:  1. Tobacco abuse  Allergies No Known Allergies   Diagnostic Studies/Procedures:  07/16/11 - Left Heart Catheterization Selective Coronary Angiography PTCA/bare metal stent x 1 proximal LAD Hemodynamic Findings:  Central aortic pressure:162/92  Left ventricular pressure: 164/7/12  Left main: No evidence of disease.  Left Anterior Descending Artery: Large vessel that courses to the apex. There is a moderate sized diagonal branch with no disease. The proximal LAD has a 95% hazy stenosis. This is likely the culprit vessel.  Circumflex Artery: Moderate sized vessel with no obstructive disease noted. Moderate sized ramus intermediate branch with 30% smooth stenosis.  Right Coronary Artery: Moderate sized, dominant vessel with 3-% proximal stenosis.  Left Ventricular Angiogram: Not performed.  Impression:  1. Single vessel CAD  2. NSTEMI secondary to severe, hazy stenosis in proximal LAD likely related to ulcerated plaque.  3. Successful placement 3.0 x 15 mm Vision bare metal stent x 1 proximal LAD.  4. Cocaine abuse. Bare metal stent placed given question of medical compliance and need for dual anti-platelet therapy.  Recommendations: ASA and Plavix for one year. P2Y12 testing in am.   07/16/11 - 2D Echocardiogram Study Conclusions: - Left ventricle: Technically difficult study. The cavity size was normal. Wall thickness was increased in a pattern of moderate LVH. The estimated ejection  fraction was 60%. Wall motion was normal; there were no regional wall motion abnormalities.   History of Present Illness: 61 y.o. female w/ PMHx significant for untreated HTN, polysubstance abuse and tobacco abuse who presented to [facility] with complaints of chest pain and was transferred to [facility] on 07/15/11 secondary to NSTEMI.  About 4 days prior to presentation patient began experiencing intermittent left-sided and substernal chest pressure and tightness occurring at rest associated with diaphoresis. Symptoms did not prevent her from using additional cocaine. The morning of presentation the patient awoke secondary to recurrent chest discomfort that lasted longer than usual and a friend drove her to [facility] Course:  At [facility] she was noted to be markedly hypertensive with pressures in the 200s and her ECG reflected LVH with repolarization abnormalities. Troponin was 0.71 with a CK-MB of 8. Urine drug screen was positive for benzodiazepines, marijuana, and cocaine. She was transferred to [facility] for further treatment.   At [facility] she was stable without chest pain on IV NTG. She was initiated on ASA, statin, CCB, ARB, and a heparin drip. BB was avoided in the setting of ongoing cocaine abuse. Echocardiogram was completed on 07/16/11 with findings as noted above. Cardiac catheterization was performed on 07/16/11 revealing severe proximal LAD stenosis for which a BMS x1 was placed in the proximal LAD. She tolerated the procedure well without complications. She was initially started on Plavix, however, her PRU was 311 and she was changed to Effient. Troponin peaked at 3.37. Her EKG had evolving changes in the anterolateral leads but pt was without chest pain. Social work  was consulted to discuss her cocaine/marijuana use. Tobacco cessation counseling was provided.  She was seen and evaluated by Dr. Clifton James who felt she was stable for discharge  home with plans to follow up with the  [facility] office. She will need follow up Lipid panel and LFTs as she was initiated on statin therapy.  Discharge Vitals: Blood pressure 158/87, pulse 68, temperature 98.3 F (36.8 C), temperature source Oral, resp. rate 20, height 5\' 4"  (1.626 m), weight 122 lb 12.7 oz (55.7 kg), SpO2 100.00%.  Labs: Component Value Date   WBC 10.1 07/17/2011   HGB 12.6 07/17/2011   HCT 37.7 07/17/2011   MCV 90.2 07/17/2011   PLT 227 07/17/2011     07/17/2011 04:30  Platelet Function  P2Y12 311   Lab 07/17/11 0430  NA 138  K 4.4  CL 105  CO2 25  BUN 11  CREATININE 0.80  CALCIUM 9.8  PROT 6.5  BILITOT 0.2*  ALKPHOS 62  ALT 14  AST 17  GLUCOSE 110*   Basename 07/15/11 0036 07/14/11 1809  CKTOTAL 150 154  CKMB 9.3* 10.5*  TROPONINI 3.37* 2.61*   Component Value Date   CHOL 189 07/15/2011   HDL 67 07/15/2011   LDLCALC 100* 07/15/2011   TRIG 109 07/15/2011     07/14/2011 18:08  Hemoglobin A1C 5.8 (H)     07/14/2011 18:08  TSH 1.306    Discharge Medications   Medication List  As of 07/17/2011  8:36 AM   TAKE these medications         amLODipine 10 MG tablet   Commonly known as: NORVASC   Take 1 tablet (10 mg total) by mouth daily.      aspirin 81 MG EC tablet   Take 1 tablet (81 mg total) by mouth daily.      losartan 50 MG tablet   Commonly known as: COZAAR   Take 1 tablet (50 mg total) by mouth daily.      nitroGLYCERIN 0.4 MG SL tablet   Commonly known as: NITROSTAT   Place 1 tablet (0.4 mg total) under the tongue every 5 (five) minutes as needed for chest pain (Up to 3 doses).      prasugrel 10 MG Tabs   Commonly known as: EFFIENT   Take 1 tablet (10 mg total) by mouth daily.      pravastatin 80 MG tablet   Commonly known as: PRAVACHOL   Take 1 tablet (80 mg total) by mouth daily.            Disposition   Discharge Orders    Future Orders Please Complete By Expires   Diet - low sodium heart healthy       Increase activity slowly      Discharge instructions      Comments:   **PLEASE REMEMBER TO BRING ALL OF YOUR MEDICATIONS TO EACH OF YOUR FOLLOW-UP OFFICE VISITS.  * KEEP WRIST CATHETERIZATION SITE CLEAN AND DRY. Call the office for any signs of bleeding, pus, swelling, increased pain, or any other concerns. * NO HEAVY LIFTING (>10lbs) X 2 WEEKS. * NO SEXUAL ACTIVITY X 2 WEEKS. * NO DRIVING X 1 WEEK. * NO SOAKING BATHS, HOT TUBS, POOLS, ETC., X 7 DAYS.  * You will need follow up labs in 6-8 weeks (Lipid panel & Liver Function Tests) as you were started on cholesterol medicine      Follow-up Information    Follow up with [facility] in  3 weeks. (Our Office will call you with you appointment time.)    Contact information:   [facility] [address] [place] [address] [phone]          Outstanding Labs/Studies: Follow up LFTs & Lipid Panel in 6-8wks  Duration of Discharge Encounter: Greater than 30 minutes including physician and PA time.  Signed, Raymie Giammarco PA-C 07/17/2011, 8:36 AM

## 2011-07-17 NOTE — Progress Notes (Signed)
CARDIAC REHAB PHASE I   PRE:  Rate/Rhythm: 80 SR    BP: sitting 178/97    SaO2:   MODE:  Ambulation: 440 ft   POST:  Rate/Rhythm: 92 SR    BP: sitting 190/121, 158/97 20 min later     SaO2:   LOB upon standing. Sts she feels a bit dizzy. Assist x1 walking, no other instability. BP after walk 190/121. Had not received morning BP meds. Ed completed with dght present.  Voices understanding although not sure pt can handle all of her changes. Encouraged nicotene replacement tx. Agreed to referral for [facility] in [place] although will have to discuss with them financial assistance.  [id number]  Harriet Masson CES, ACSM

## 2011-07-17 NOTE — Progress Notes (Signed)
   CARE MANAGEMENT NOTE 07/17/2011  Patient:  Melissa Mccall,Melissa Mccall   Account Number:  [id number]  Date Initiated:  07/17/2011  Documentation initiated by:  GRAVES-BIGELOW,Dray Dente  Subjective/Objective Assessment:   Pt in with [facility]. Plan for home on effient. CSW did speak to pt and referred her to [facility] and CM then called to make sure she will be able to get assistance with bill and pt has questions on disability and medicaid assistance.     Action/Plan:   CM told pt to contact her DSS to see if they can assist with future help. Cm did call [facility] to change meds [facility] [place] for cheaper medications. Pt filled out [place] patient assist forms and she will fax in.   Anticipated DC Date:  07/17/2011   Anticipated DC Plan:  HOME/SELF CARE  In-house referral  Clinical Social Worker      DC Planning Services  CM consult      Choice offered to / List presented to:             Status of service:  Completed, signed off Medicare Important Message given?   (If response is "NO", the following Medicare IM given date fields will be blank) Date Medicare IM given:   Date Additional Medicare IM given:    Discharge Disposition:  HOME/SELF CARE  Per UR Regulation:    Comments:  07-17-11 1228 Tomi Bamberger, RN,BSN [phone] CM did give pt 30 day free card and she is aware that patient assist forms may take up until 3-4 weeks for approval. Pt d/c home with daughter.

## 2011-07-17 NOTE — Progress Notes (Signed)
SUBJECTIVE: No events. No chest pain or SOB.   BP 158/87  Pulse 68  Temp(Src) 98.3 F (36.8 C) (Oral)  Resp 20  Ht 5\' 4"  (1.626 m)  Wt 122 lb 12.7 oz (55.7 kg)  BMI 21.08 kg/m2  SpO2 100%  Intake/Output Summary (Last 24 hours) at 07/17/11 2130 Last data filed at 07/16/11 2129  Gross per 24 hour  Intake 1257.45 ml  Output    600 ml  Net 657.45 ml    PHYSICAL EXAM General: Well developed, well nourished, in no acute distress. Alert and oriented x 3.  Psych:  Good affect, responds appropriately Neck: No JVD. No masses noted.  Lungs: Clear bilaterally with no wheezes or rhonci noted.  Heart: RRR with no murmurs noted. Abdomen: Bowel sounds are present. Soft, non-tender.  Extremities: No lower extremity edema. Right wrist cath site ok.   LABS: Basic Metabolic Panel:  Basename 07/17/11 0430 07/16/11 0645  NA 138 138  K 4.4 3.9  CL 105 104  CO2 25 25  GLUCOSE 110* 112*  BUN 11 11  CREATININE 0.80 0.81  CALCIUM 9.8 9.4  MG -- --  PHOS -- --   CBC:  Basename 07/17/11 0430 07/16/11 0645  WBC 10.1 8.8  NEUTROABS -- --  HGB 12.6 12.6  HCT 37.7 37.7  MCV 90.2 89.5  PLT 227 220   Cardiac Enzymes:  Basename 07/15/11 0036 07/14/11 1809  CKTOTAL 150 154  CKMB 9.3* 10.5*  CKMBINDEX -- --  TROPONINI 3.37* 2.61*   Fasting Lipid Panel:  Basename 07/15/11 0525  CHOL 189  HDL 67  LDLCALC 100*  TRIG 109  CHOLHDL 2.8  LDLDIRECT --    Current Meds:    . amLODipine  5 mg Oral Daily  . aspirin EC  81 mg Oral Daily  . bivalirudin      . clopidogrel  75 mg Oral Q breakfast  . fentaNYL      . heparin      . heparin      . lidocaine      . losartan  50 mg Oral Daily  . midazolam      . midazolam      . nitroGLYCERIN  1 inch Topical Q6H  . nitroGLYCERIN      . rosuvastatin  40 mg Oral q1800  . sodium chloride  3 mL Intravenous Q12H  . verapamil      . verapamil         ASSESSMENT AND PLAN:  1. NSTEMI: s/p cardiac cath yesterday with severe proximal LAD  stenosis now s/p bare metal stent x 1 proximal LAD. She is on ASA and Plavix (she had been loaded with Plavix the day before her cath). Platelet testing this am with PRU of 311. Will load with Effient 60 mg po x 1 this am and discharge home with Effient 10 mg po Qdaily. She will need Effient starter pack and will need paperwork for assistance program. Will contact case management this am. D/C Plavix.  Continue statin. She has not been on a beta blocker secondary to her history of cocaine abuse with recent use. Will change statin to Pravastatin 80 mg po QHS for discharge. EKG with evolving changes anterolateral leads but pt without chest pain. (Telemetry with ST elevation in lead V2, continuous for two days).   2. HTN: Uncontrolled. Will increase Norvasc to 10 mg po Qdaily.   3. Tobacco abuse: Complete cessation recommended.   4. Cocaine abuse/Marijuana abuse: Cessation recommended.  5. Dispo: D/C home today. Follow up in 2-3 weeks in [place] office.   [name],[name]  1/29/20136:53 AM

## 2011-07-17 NOTE — Progress Notes (Signed)
Clinical Social Worker received consult for current substance abuse. CSW met with patient and daughter, Boneta Lucks, who was at bedside. CSW completed a psychosocial assessment, which can be found in the shadow chart. CSW completed an SBIRT. CSW provided patient with resources for AA/NA meetings and outpatient agencies in [place], [place] and [place], [place] area. CSW called financial counselor to speak with patient regarding her self-pay status. Patient was pleasant to speak with. She stated that she can quit, if she wants to quit abusing substances. Patient appeared to not be agreeable to inpatient setting of rehab as she "doesn't like for people to tell me what to do." Patient appeared to be willing to consider AA/NA meetings and having to opportunity to share her experiences and listening to others. Patient stated that her heart attack is driving force for her to get her substance use under control. Patient has a supportive family around her. Patient and CSW spoke about potential triggers for her and finding alternative ways to handle stress and being upset if she gets the urge to use.  Per daughter this has been a long habit of the patient and daughter feels patient could benefit from counseling.  Rozetta Nunnery MSW, [employer] [phone]

## 2011-08-01 ENCOUNTER — Encounter: Payer: Self-pay | Admitting: Cardiology

## 2011-08-01 DIAGNOSIS — Z720 Tobacco use: Secondary | ICD-10-CM | POA: Insufficient documentation

## 2011-08-01 DIAGNOSIS — Z1630 Resistance to unspecified antimicrobial drugs: Secondary | ICD-10-CM | POA: Insufficient documentation

## 2011-08-01 DIAGNOSIS — R9430 Abnormal result of cardiovascular function study, unspecified: Secondary | ICD-10-CM | POA: Insufficient documentation

## 2011-08-01 DIAGNOSIS — I10 Essential (primary) hypertension: Secondary | ICD-10-CM | POA: Insufficient documentation

## 2011-08-03 ENCOUNTER — Ambulatory Visit (INDEPENDENT_AMBULATORY_CARE_PROVIDER_SITE_OTHER): Payer: Self-pay | Admitting: Cardiology

## 2011-08-03 ENCOUNTER — Encounter: Payer: Self-pay | Admitting: Cardiology

## 2011-08-03 DIAGNOSIS — F17200 Nicotine dependence, unspecified, uncomplicated: Secondary | ICD-10-CM

## 2011-08-03 DIAGNOSIS — I10 Essential (primary) hypertension: Secondary | ICD-10-CM

## 2011-08-03 DIAGNOSIS — F1410 Cocaine abuse, uncomplicated: Secondary | ICD-10-CM

## 2011-08-03 DIAGNOSIS — I214 Non-ST elevation (NSTEMI) myocardial infarction: Secondary | ICD-10-CM

## 2011-08-03 DIAGNOSIS — T148XXA Other injury of unspecified body region, initial encounter: Secondary | ICD-10-CM | POA: Insufficient documentation

## 2011-08-03 DIAGNOSIS — Z720 Tobacco use: Secondary | ICD-10-CM

## 2011-08-03 MED ORDER — CLOPIDOGREL BISULFATE 75 MG PO TABS: 75.0000 mg | ORAL_TABLET | Freq: Every day | ORAL | Status: DC

## 2011-08-03 NOTE — Progress Notes (Signed)
HPI Patient is seen today post hospitalization for a non-STEMI. As part of today's evaluation I have reviewed her hospital records extensively.  The patient presented with a non-STEMI on July 16, 2011. She was treated with an urgent bare-metal stent. This was used because of polysubstance abuse. Fortunately today she says that she has stopped cocaine completely. She is drinking some alcohol. She has stopped smoking cigarettes. She is here with a family member who is very knowledgeable and helping a great deal. She's had some mild headaches since being in the hospital. She's not had significant chest pain. She is complaining of bruising from her dual antiplatelet therapy. No Known Allergies  Current Outpatient Prescriptions  Medication Sig Dispense Refill  . amLODipine (NORVASC) 10 MG tablet Take 1 tablet (10 mg total) by mouth daily.  30 tablet  6  . aspirin EC 81 MG EC tablet Take 1 tablet (81 mg total) by mouth daily.      Marland Kitchen losartan (COZAAR) 50 MG tablet Take 1 tablet (50 mg total) by mouth daily.  30 tablet  6  . nitroGLYCERIN (NITROSTAT) 0.4 MG SL tablet Place 1 tablet (0.4 mg total) under the tongue every 5 (five) minutes as needed for chest pain (Up to 3 doses).  25 tablet  3  . prasugrel (EFFIENT) 10 MG TABS Take 1 tablet (10 mg total) by mouth daily.  30 tablet  6  . pravastatin (PRAVACHOL) 80 MG tablet Take 1 tablet (80 mg total) by mouth daily.  30 tablet  6    History   Social History  . Marital Status: Single    Spouse Name: N/A    Number of Children: N/A  . Years of Education: N/A   Occupational History  . Not on file.   Social History Main Topics  . Smoking status: Former Smoker -- 1.0 packs/day for 40 years    Types: Cigarettes  . Smokeless tobacco: Never Used   Comment: pt states quit sometime in early feb 2013  . Alcohol Use: 0.0 oz/week     40 oz beer/day and sometimes a bottle of wine  . Drug Use: Yes     marijuana at least weekly (last used 1 wk ago); says  she's tried cocaine and has used several times in the last week - last ~ 2 days ago  . Sexually Active: Yes   Other Topics Concern  . Not on file   Social History Narrative   Lives in [place].  Doesn't work or exercise.      Family History  Problem Relation Age of Onset  . Hypertension Mother     died in 16's  . Diabetes Mother   . Hypertension Father     died in 69's  . Colon cancer Brother     died in 64's    Past Medical History  Diagnosis Date  . NSTEMI (non-ST elevated myocardial infarction)     January, 2013, bare-metal stent  to proximal LAD  . HTN (hypertension)   . Cocaine abuse   . Tobacco abuse   . Marijuana abuse   . Ejection fraction     EF 60%, echo, July 16, 2011  . Drug resistance     P2Y12 was 311 (>208) January, 2013, therefore Effient (Prasugrel) used.    Past Surgical History  Procedure Date  . Cesarean section     x 2    ROS  Patient denies fever, chills, headache, sweats, rash, change in vision, change in hearing, cough,  nausea vomiting, urinary symptoms. All other systems are reviewed and are negative.  PHYSICAL EXAM Patient is oriented to person time and place. Affect is normal. There is no jugulovenous distention. Lungs are clear. Respiratory effort is nonlabored. Cardiac exam reveals S1-S2. There no clicks or significant murmurs. Abdomen is soft. There is no peripheral edema. She does have some bruising on her legs from her medications. There are no skin rashes. There no musculoskeletal deformities. Filed Vitals:   08/03/11 0956  BP: 147/74  Pulse: 65  Height: 5\' 4"  (1.626 m)  Weight: 125 lb (56.7 kg)  SpO2: 99%    ASSESSMENT & PLAN

## 2011-08-03 NOTE — Assessment & Plan Note (Signed)
Patient says that she is stop smoking.

## 2011-08-03 NOTE — Assessment & Plan Note (Signed)
She will complete one month of prasugrel. Then we will switch to Plavix

## 2011-08-03 NOTE — Assessment & Plan Note (Signed)
Patient says that she is completely stopped cocaine use.

## 2011-08-03 NOTE — Patient Instructions (Signed)
Follow up as scheduled. Finish Current supply of Effient and then stop. Start Plavix (clopidogrel) 75 mg daily after stopping Effient.

## 2011-08-03 NOTE — Assessment & Plan Note (Signed)
Patient is not having any significant recurrent chest pain. She received bare-metal stent because of her polysubstance abuse history. She's having bruisability. We will complete one month of Prasugrel . I will then switch her to Plavix. If she has significant problems with Plavix we will use aspirin only.

## 2011-08-03 NOTE — Assessment & Plan Note (Signed)
Her blood pressure is under much better control. He was quite high in the hospital. I reminded her she must continue her medications.

## 2011-10-30 ENCOUNTER — Ambulatory Visit: Payer: Self-pay | Admitting: Cardiology

## 2012-03-14 ENCOUNTER — Other Ambulatory Visit: Payer: Self-pay | Admitting: Cardiology

## 2012-04-15 ENCOUNTER — Other Ambulatory Visit: Payer: Self-pay | Admitting: Cardiology

## 2012-04-16 NOTE — Telephone Encounter (Signed)
Due for an appt with Dr Myrtis Ser

## 2012-05-22 ENCOUNTER — Other Ambulatory Visit: Payer: Self-pay | Admitting: Cardiology

## 2012-05-28 MED ORDER — CLOPIDOGREL BISULFATE 75 MG PO TABS: 75.0000 mg | ORAL_TABLET | Freq: Every day | ORAL | Status: DC

## 2012-07-03 ENCOUNTER — Ambulatory Visit: Payer: Self-pay | Admitting: Physician Assistant

## 2012-07-21 ENCOUNTER — Ambulatory Visit: Payer: Self-pay | Admitting: Physician Assistant

## 2012-10-10 ENCOUNTER — Other Ambulatory Visit: Payer: Self-pay

## 2012-10-10 MED ORDER — LOSARTAN POTASSIUM 50 MG PO TABS: 50.0000 mg | ORAL_TABLET | Freq: Every day | ORAL | Status: DC

## 2012-10-14 ENCOUNTER — Other Ambulatory Visit: Payer: Self-pay

## 2012-10-14 MED ORDER — AMLODIPINE BESYLATE 10 MG PO TABS: 10.0000 mg | ORAL_TABLET | Freq: Every day | ORAL | Status: DC

## 2012-11-20 ENCOUNTER — Other Ambulatory Visit: Payer: Self-pay

## 2012-11-20 MED ORDER — AMLODIPINE BESYLATE 10 MG PO TABS: 10.0000 mg | ORAL_TABLET | Freq: Every day | ORAL | Status: DC

## 2012-11-20 MED ORDER — LOSARTAN POTASSIUM 50 MG PO TABS: 50.0000 mg | ORAL_TABLET | Freq: Every day | ORAL | Status: DC

## 2013-01-23 ENCOUNTER — Encounter: Payer: Self-pay | Admitting: Cardiology

## 2013-01-23 ENCOUNTER — Ambulatory Visit (INDEPENDENT_AMBULATORY_CARE_PROVIDER_SITE_OTHER): Payer: Medicaid - Out of State | Admitting: Cardiology

## 2013-01-23 VITALS — BP 148/102 | HR 66 | Ht 64.0 in | Wt 123.0 lb

## 2013-01-23 DIAGNOSIS — I2510 Atherosclerotic heart disease of native coronary artery without angina pectoris: Secondary | ICD-10-CM | POA: Insufficient documentation

## 2013-01-23 DIAGNOSIS — R42 Dizziness and giddiness: Secondary | ICD-10-CM

## 2013-01-23 DIAGNOSIS — I25119 Atherosclerotic heart disease of native coronary artery with unspecified angina pectoris: Secondary | ICD-10-CM | POA: Insufficient documentation

## 2013-01-23 DIAGNOSIS — I10 Essential (primary) hypertension: Secondary | ICD-10-CM

## 2013-01-23 MED ORDER — NITROGLYCERIN 0.4 MG SL SUBL: 0.4000 mg | SUBLINGUAL_TABLET | SUBLINGUAL | Status: DC | PRN

## 2013-01-23 MED ORDER — LOSARTAN POTASSIUM 100 MG PO TABS: 100.0000 mg | ORAL_TABLET | Freq: Every day | ORAL | Status: DC

## 2013-01-23 MED ORDER — ASPIRIN EC 81 MG PO TBEC: 81.0000 mg | DELAYED_RELEASE_TABLET | Freq: Every day | ORAL | Status: DC

## 2013-01-23 NOTE — Assessment & Plan Note (Signed)
Blood pressure is elevated today. I'm not sure that she's taking her medicines appropriately. I will increase her losartan dose. Then she can be seen for followup.

## 2013-01-23 NOTE — Progress Notes (Signed)
HPI  The patient is seen to followup coronary disease. She had a non-STEMI January, 2013. Was treated with a bare-metal stent. There was a history of polysubstance abuse. She's not having any chest pain. She's had some dizziness when standing. There may be a component of vertigo. There has not been any true syncope or presyncope. For some reason she is on Plavix and not aspirin. She is far enough out from her stent that this is not a problem. When her Plavix runs out she will be switched to aspirin 81. She is not aspirin allergic.  No Known Allergies  Current Outpatient Prescriptions  Medication Sig Dispense Refill  . amLODipine (NORVASC) 10 MG tablet Take 1 tablet (10 mg total) by mouth daily.  30 tablet  2  . clopidogrel (PLAVIX) 75 MG tablet Take 1 tablet (75 mg total) by mouth daily.  30 tablet  5  . losartan (COZAAR) 50 MG tablet Take 1 tablet (50 mg total) by mouth daily.  30 tablet  2  . nitroGLYCERIN (NITROSTAT) 0.4 MG SL tablet Place 0.4 mg under the tongue every 5 (five) minutes as needed for chest pain (Up to 3 doses).      . pravastatin (PRAVACHOL) 40 MG tablet TAKE TWO TABLETS EACH DAY  60 tablet  5   No current facility-administered medications for this visit.    History   Social History  . Marital Status: Single    Spouse Name: N/A    Number of Children: N/A  . Years of Education: N/A   Occupational History  . Not on file.   Social History Main Topics  . Smoking status: Former Smoker -- 1.00 packs/day for 40 years    Types: Cigarettes  . Smokeless tobacco: Never Used     Comment: pt states quit sometime in early feb 2013  . Alcohol Use: 0.0 oz/week     Comment: 40 oz beer/day and sometimes a bottle of wine  . Drug Use: Yes     Comment: marijuana at least weekly (last used 1 wk ago); says she's tried cocaine and has used several times in the last week - last ~ 2 days ago  . Sexually Active: Yes   Other Topics Concern  . Not on file   Social History Narrative    Lives in [place].  Doesn't work or exercise.      Family History  Problem Relation Age of Onset  . Hypertension Mother     died in 6's  . Diabetes Mother   . Hypertension Father     died in 20's  . Colon cancer Brother     died in 15's    Past Medical History  Diagnosis Date  . NSTEMI (non-ST elevated myocardial infarction)     January, 2013, bare-metal stent  to proximal LAD  . HTN (hypertension)   . Cocaine abuse   . Tobacco abuse   . Marijuana abuse   . Ejection fraction     EF 60%, echo, July 16, 2011  . Drug resistance     P2Y12 was 311 (>208) January, 2013, therefore Effient (Prasugrel) used.  . Bruising     Aspirin and Effient February, 2013    Past Surgical History  Procedure Laterality Date  . Cesarean section      x 2    Patient Active Problem List   Diagnosis Date Noted  . Bruising   . NSTEMI (non-ST elevated myocardial infarction)   . HTN (hypertension)   .  Tobacco abuse   . Ejection fraction   . Drug resistance   . Cocaine abuse   . Marijuana abuse     ROS   Patient denies fever, chills, headache, sweats, rash, change in vision, change in hearing, chest pain, cough, nausea vomiting, urinary symptoms. All other systems are reviewed and are negative.  PHYSICAL EXAM  Patient is oriented to person time and place. Affect is normal. There is no jugulovenous distention. Lungs are clear. Respiratory effort is nonlabored. Cardiac exam reveals S1 and S2. There no clicks or significant murmurs. The abdomen is soft. Is no peripheral edema.  Filed Vitals:   01/23/13 1410  BP: 148/102  Pulse: 66  Height: 5\' 4"  (1.626 m)  Weight: 123 lb (55.792 kg)   EKG is done today and reviewed by me. There is sinus rhythm. There are no significant changes.  ASSESSMENT & PLAN

## 2013-01-23 NOTE — Assessment & Plan Note (Signed)
Her coronary disease is stable. No change in therapy. 

## 2013-01-23 NOTE — Patient Instructions (Addendum)
Your physician recommends that you schedule a follow-up appointment in: 3 months. Your physician has recommended you make the following change in your medication: After you finish your current supply of plavix, stop it and start aspirin 81 mg daily. Also your losartan has been increased to 100 mg daily. Please take (2) of your 50 mg daily until they are finished. Your new prescriptions have been sent to your pharmacy. All other medications will remain the same.

## 2013-01-23 NOTE — Assessment & Plan Note (Signed)
She's been having some mild nonspecific dizziness. There may be a small component of vertigo. Her diastolic blood pressure is elevated. Blood pressure meds will be adjusted.

## 2013-02-19 ENCOUNTER — Other Ambulatory Visit: Payer: Self-pay | Admitting: Cardiology

## 2013-02-25 ENCOUNTER — Other Ambulatory Visit: Payer: Self-pay | Admitting: Cardiology

## 2013-03-05 ENCOUNTER — Ambulatory Visit: Payer: Medicaid - Out of State | Admitting: Cardiovascular Disease

## 2013-03-16 ENCOUNTER — Encounter (HOSPITAL_COMMUNITY): Payer: Self-pay | Admitting: *Deleted

## 2013-03-16 ENCOUNTER — Emergency Department (HOSPITAL_COMMUNITY)
Admission: EM | Admit: 2013-03-16 | Discharge: 2013-03-17 | Disposition: A | Discharge status: Home / Self Care | Payer: Medicaid - Out of State | Attending: Emergency Medicine | Admitting: Emergency Medicine

## 2013-03-16 DIAGNOSIS — I10 Essential (primary) hypertension: Secondary | ICD-10-CM | POA: Insufficient documentation

## 2013-03-16 DIAGNOSIS — R42 Dizziness and giddiness: Secondary | ICD-10-CM | POA: Insufficient documentation

## 2013-03-16 DIAGNOSIS — Z87891 Personal history of nicotine dependence: Secondary | ICD-10-CM | POA: Insufficient documentation

## 2013-03-16 DIAGNOSIS — Z7902 Long term (current) use of antithrombotics/antiplatelets: Secondary | ICD-10-CM | POA: Insufficient documentation

## 2013-03-16 DIAGNOSIS — Z7982 Long term (current) use of aspirin: Secondary | ICD-10-CM | POA: Insufficient documentation

## 2013-03-16 DIAGNOSIS — I2510 Atherosclerotic heart disease of native coronary artery without angina pectoris: Secondary | ICD-10-CM | POA: Insufficient documentation

## 2013-03-16 DIAGNOSIS — Z79899 Other long term (current) drug therapy: Secondary | ICD-10-CM | POA: Insufficient documentation

## 2013-03-16 DIAGNOSIS — I252 Old myocardial infarction: Secondary | ICD-10-CM | POA: Insufficient documentation

## 2013-03-16 NOTE — ED Notes (Signed)
Dizzy for 3 days, bp was low when checked at home.  Losartin was increased from 50 mg to 100 on 9/10.

## 2013-03-17 LAB — CBC WITH DIFFERENTIAL/PLATELET
Hemoglobin: 12.3 g/dL (ref 12.0–15.0)
Lymphocytes Relative: 40 % (ref 12–46)
Lymphs Abs: 2.9 10*3/uL (ref 0.7–4.0)
Monocytes Relative: 10 % (ref 3–12)
Neutro Abs: 3.4 10*3/uL (ref 1.7–7.7)
Neutrophils Relative %: 45 % (ref 43–77)
Platelets: 246 10*3/uL (ref 150–400)
RBC: 4.05 MIL/uL (ref 3.87–5.11)
WBC: 7.4 10*3/uL (ref 4.0–10.5)

## 2013-03-17 LAB — BASIC METABOLIC PANEL
BUN: 11 mg/dL (ref 6–23)
Chloride: 103 mEq/L (ref 96–112)
GFR calc non Af Amer: >90 mL/min (ref >90)
Glucose, Bld: 98 mg/dL (ref 70–99)
Potassium: 3.8 mEq/L (ref 3.5–5.1)
Sodium: 139 mEq/L (ref 135–145)

## 2013-03-17 NOTE — ED Provider Notes (Signed)
CSN: [id number]     Arrival date & time 03/16/13  2304 History   First MD Initiated Contact with Patient 03/16/13 2330     Chief Complaint  Patient presents with  . Dizziness   (Consider location/radiation/quality/duration/timing/severity/associated sxs/prior Treatment) HPI Pt with history of HTN, MI reports about 2 months of mild, intermittent dizziness she describes as feeling off balance at times when walking. Also reports occasionally seeing floaters in her vision, denies headache, blurry vision, lightheadedness, CP, SOB, N/V/D, fever. She has not fallen or passed out. She had her [place] increased at Cardiology visit several weeks ago. She reports she recently got a home BP cuff and reports it was reading 65/60 and so she came to the ED for evaluation.   Past Medical History  Diagnosis Date  . NSTEMI (non-ST elevated myocardial infarction)     January, 2013, bare-metal stent  to proximal LAD  . HTN (hypertension)   . Cocaine abuse   . Tobacco abuse   . Marijuana abuse   . Ejection fraction     EF 60%, echo, July 16, 2011  . Drug resistance     P2Y12 was 311 (>208) January, 2013, therefore Effient (Prasugrel) used.  . Bruising     Aspirin and Effient February, 2013  . CAD (coronary artery disease)   . Dizziness    Past Surgical History  Procedure Laterality Date  . Cesarean section      x 2   Family History  Problem Relation Age of Onset  . Hypertension Mother     died in 43's  . Diabetes Mother   . Hypertension Father     died in 7's  . Colon cancer Brother     died in 78's   History  Substance Use Topics  . Smoking status: Former Smoker -- 1.00 packs/day for 40 years    Types: Cigarettes  . Smokeless tobacco: Never Used     Comment: pt states quit sometime in early feb 2013  . Alcohol Use: 0.0 oz/week     Comment: 40 oz beer/day and sometimes a bottle of wine   OB History   Grav Para Term Preterm Abortions TAB SAB Ect Mult Living                  Review of Systems All other systems reviewed and are negative except as noted in HPI.   Allergies  Review of patient's allergies indicates no known allergies.  Home Medications   Current Outpatient Rx  Name  Route  Sig  Dispense  Refill  . amLODipine (NORVASC) 10 MG tablet      TAKE ONE TABLET EACH DAY   30 tablet   3   . aspirin EC 81 MG tablet   Oral   Take 1 tablet (81 mg total) by mouth daily.   90 tablet   3     Stop plavix after current supply finished and star ...   . clopidogrel (PLAVIX) 75 MG tablet   Oral   Take 1 tablet (75 mg total) by mouth daily.   30 tablet   5   . losartan (COZAAR) 100 MG tablet      TAKE ONE TABLET EACH DAY   30 tablet   6   . nitroGLYCERIN (NITROSTAT) 0.4 MG SL tablet   Sublingual   Place 1 tablet (0.4 mg total) under the tongue every 5 (five) minutes as needed for chest pain (Up to 3 doses).   25 tablet  3   . pravastatin (PRAVACHOL) 40 MG tablet      TAKE TWO TABLETS EACH DAY   60 tablet   5    BP 130/88  Pulse 68  Temp(Src) 97.9 F (36.6 C) (Oral)  Resp 18  SpO2 100% Physical Exam  Nursing note and vitals reviewed. Constitutional: She is oriented to person, place, and time. She appears well-developed and well-nourished.  HENT:  Head: Normocephalic and atraumatic.  Eyes: EOM are normal. Pupils are equal, round, and reactive to light.  Neck: Normal range of motion. Neck supple.  Cardiovascular: Normal rate, normal heart sounds and intact distal pulses.   Pulmonary/Chest: Effort normal and breath sounds normal.  Abdominal: Bowel sounds are normal. She exhibits no distension. There is no tenderness.  Musculoskeletal: Normal range of motion. She exhibits no edema and no tenderness.  Neurological: She is alert and oriented to person, place, and time. She has normal strength. No cranial nerve deficit or sensory deficit.  Skin: Skin is warm and dry. No rash noted.  Psychiatric: She has a normal mood and affect.     ED Course  Procedures (including critical care time) Labs Review Labs Reviewed  CBC WITH DIFFERENTIAL  BASIC METABOLIC PANEL   Imaging Review No results found.  MDM   1. Dizziness     Pt assured that her home BP cuff reading is not accurate and should be confirmed with either pharmacy or PCP machine. Doubt her dizziness is any acute life-threatening etiology given duration of symptoms. Will check basic labs for anemia or elyte disturbance. EKG is normal.   ECG interpretation   Date: 03/17/2013  Rate: 62  Rhythm: normal sinus rhythm  QRS Axis: normal  Intervals: normal  ST/T Wave abnormalities: normal  Conduction Disutrbances: none  Narrative Interpretation:   Old EKG Reviewed: No significant changes noted  Labs have not resulted into EPIC due to intermittent connectivity issues, but I was able to review printed results sheets and labs are unremarkable. Pt eager to go home.      Tangie Stay B. Bernette Mayers, MD 03/17/13 [phone]

## 2013-05-04 ENCOUNTER — Ambulatory Visit (INDEPENDENT_AMBULATORY_CARE_PROVIDER_SITE_OTHER): Payer: Medicaid - Out of State | Admitting: Cardiology

## 2013-05-04 ENCOUNTER — Encounter: Payer: Self-pay | Admitting: Cardiology

## 2013-05-04 VITALS — BP 131/90 | HR 70 | Ht 64.0 in | Wt 134.1 lb

## 2013-05-04 DIAGNOSIS — I2510 Atherosclerotic heart disease of native coronary artery without angina pectoris: Secondary | ICD-10-CM

## 2013-05-04 DIAGNOSIS — I10 Essential (primary) hypertension: Secondary | ICD-10-CM

## 2013-05-04 NOTE — Assessment & Plan Note (Signed)
Blood pressures under good control at this time. No change in therapy.

## 2013-05-04 NOTE — Patient Instructions (Signed)

## 2013-05-04 NOTE — Assessment & Plan Note (Signed)
Coronary disease is stable. No change in therapy. She is on good medications for her. No change in therapy.

## 2013-05-04 NOTE — Progress Notes (Signed)
HPI  Patient is seen in followup coronary disease. She had a non-STEMI in January, 2013. She was treated with bare-metal stent because of a history of polysubstance abuse. She's actually done better. She had some mild dizziness. Ultimately her Plavix was stopped. She's doing well. She is on appropriate medications. I have adjusted her blood pressure meds at the time of her last visit. She is not having any further significant dizziness.  No Known Allergies  Current Outpatient Prescriptions  Medication Sig Dispense Refill  . amLODipine (NORVASC) 10 MG tablet TAKE ONE TABLET EACH DAY  30 tablet  3  . aspirin EC 81 MG tablet Take 1 tablet (81 mg total) by mouth daily.  90 tablet  3  . losartan (COZAAR) 100 MG tablet TAKE ONE TABLET EACH DAY  30 tablet  6  . nitroGLYCERIN (NITROSTAT) 0.4 MG SL tablet Place 1 tablet (0.4 mg total) under the tongue every 5 (five) minutes as needed for chest pain (Up to 3 doses).  25 tablet  3  . pravastatin (PRAVACHOL) 40 MG tablet TAKE TWO TABLETS every evening       No current facility-administered medications for this visit.    History   Social History  . Marital Status: Single    Spouse Name: N/A    Number of Children: N/A  . Years of Education: N/A   Occupational History  . Not on file.   Social History Main Topics  . Smoking status: Former Smoker -- 1.00 packs/day for 40 years    Types: Cigarettes  . Smokeless tobacco: Never Used     Comment: pt states quit sometime in early feb 2013  . Alcohol Use: 0.0 oz/week     Comment: 40 oz beer/day and sometimes a bottle of wine  . Drug Use: Yes     Comment: marijuana at least weekly (last used 1 wk ago); says she's tried cocaine and has used several times in the last week - last ~ 2 days ago  . Sexual Activity: Yes   Other Topics Concern  . Not on file   Social History Narrative   Lives in [place].  Doesn't work or exercise.      Family History  Problem Relation Age of Onset  . Hypertension  Mother     died in 71's  . Diabetes Mother   . Hypertension Father     died in 77's  . Colon cancer Brother     died in 7's    Past Medical History  Diagnosis Date  . NSTEMI (non-ST elevated myocardial infarction)     January, 2013, bare-metal stent  to proximal LAD  . HTN (hypertension)   . Cocaine abuse   . Tobacco abuse   . Marijuana abuse   . Ejection fraction     EF 60%, echo, July 16, 2011  . Drug resistance     P2Y12 was 311 (>208) January, 2013, therefore Effient (Prasugrel) used.  . Bruising     Aspirin and Effient February, 2013  . CAD (coronary artery disease)   . Dizziness     Past Surgical History  Procedure Laterality Date  . Cesarean section      x 2    Patient Active Problem List   Diagnosis Date Noted  . CAD (coronary artery disease)   . Dizziness   . Bruising   . HTN (hypertension)   . Tobacco abuse   . Ejection fraction   . Drug resistance   .  Cocaine abuse   . Marijuana abuse     ROS   Patient denies fever, chills, headache, sweats, rash, change in vision, change in hearing, chest pain, cough, nausea vomiting, urinary symptoms. All other systems are reviewed and are negative.  PHYSICAL EXAM  Patient is oriented to person time and place. Affect is normal. There is no jugulovenous distention. Lungs are clear. Respiratory effort is nonlabored. Cardiac exam reveals S1 and S2. There no clicks or significant murmurs. The abdomen is soft. There is no peripheral edema.  Filed Vitals:   05/04/13 1351  BP: 131/90  Pulse: 70  Height: 5\' 4"  (1.626 m)  Weight: 134 lb 1.9 oz (60.836 kg)  SpO2: 98%     ASSESSMENT & PLAN

## 2013-06-25 ENCOUNTER — Other Ambulatory Visit: Payer: Self-pay | Admitting: Cardiology

## 2013-07-15 ENCOUNTER — Other Ambulatory Visit: Payer: Self-pay | Admitting: Cardiology

## 2013-09-14 ENCOUNTER — Other Ambulatory Visit: Payer: Self-pay | Admitting: Cardiology

## 2013-09-17 ENCOUNTER — Other Ambulatory Visit: Payer: Self-pay | Admitting: Cardiology

## 2013-09-17 MED ORDER — PRAVASTATIN SODIUM 80 MG PO TABS: 80.0000 mg | ORAL_TABLET | Freq: Every day | ORAL | Status: DC

## 2014-01-18 ENCOUNTER — Other Ambulatory Visit: Payer: Self-pay | Admitting: Cardiology

## 2014-02-17 ENCOUNTER — Other Ambulatory Visit: Payer: Self-pay | Admitting: Cardiology

## 2014-05-03 ENCOUNTER — Ambulatory Visit (INDEPENDENT_AMBULATORY_CARE_PROVIDER_SITE_OTHER): Payer: Medicaid - Out of State | Admitting: Cardiology

## 2014-05-03 ENCOUNTER — Encounter: Payer: Self-pay | Admitting: Cardiology

## 2014-05-03 VITALS — BP 138/86 | HR 63 | Ht 64.0 in | Wt 138.0 lb

## 2014-05-03 DIAGNOSIS — Z136 Encounter for screening for cardiovascular disorders: Secondary | ICD-10-CM

## 2014-05-03 DIAGNOSIS — I10 Essential (primary) hypertension: Secondary | ICD-10-CM

## 2014-05-03 DIAGNOSIS — I2510 Atherosclerotic heart disease of native coronary artery without angina pectoris: Secondary | ICD-10-CM

## 2014-05-03 MED ORDER — LOSARTAN POTASSIUM 100 MG PO TABS: 100.0000 mg | ORAL_TABLET | Freq: Every day | ORAL | Status: DC

## 2014-05-03 MED ORDER — ATORVASTATIN CALCIUM 20 MG PO TABS: 20.0000 mg | ORAL_TABLET | Freq: Every day | ORAL | Status: DC

## 2014-05-03 NOTE — Assessment & Plan Note (Signed)
Blood pressure control. No change in therapy. 

## 2014-05-03 NOTE — Patient Instructions (Signed)
   Stop Pravastatin  Begin Atorvastatin (Lipitor) 20mg  daily - new sent to pharm  Refill also sent to pharm on the Losartan Continue all other medications.   Your physician wants you to follow up in:  1 year.  You will receive a reminder letter in the mail one-two months in advance.  If you don't receive a letter, please call our office to schedule the follow up appointment

## 2014-05-03 NOTE — Assessment & Plan Note (Signed)
Coronary disease is stable. She's not having any significant symptoms. I am recommending no testing at this time. She is on aspirin and a statin. I preference will be to use a more intensive statin if she is willing to. This would comply with guidelines.

## 2014-05-03 NOTE — Progress Notes (Signed)
Patient ID: Melissa Mccall, female   DOB: 03-07-1951, 63 y.o.   MRN: [id number]    HPI  patient is seen to follow up coronary disease. Eyes are last November, 2014. Fortunately she is doing very well. She had a non-STEMI in January, 2013. She was treated with a bare metal stent because of her history of polysubstance abuse. She's done well. Ultimately Plavix was stopped and she remains on aspirin. She's not having any significant chest pain. It is of note that with her original presentation she felt poorly with some vague abdominal discomfort. However she did not have classic chest pain.  No Known Allergies  Current Outpatient Prescriptions  Medication Sig Dispense Refill  . amLODipine (NORVASC) 10 MG tablet TAKE ONE TABLET EACH DAY 30 tablet 6  . ASPIRIN LOW DOSE 81 MG EC tablet TAKE ONE TABLET EACH DAY 90 tablet 3  . losartan (COZAAR) 100 MG tablet TAKE ONE TABLET EACH DAY 30 tablet 6  . nitroGLYCERIN (NITROSTAT) 0.4 MG SL tablet Place 1 tablet (0.4 mg total) under the tongue every 5 (five) minutes as needed for chest pain (Up to 3 doses). 25 tablet 3  . pravastatin (PRAVACHOL) 80 MG tablet Take 1 tablet (80 mg total) by mouth daily. 30 tablet 6   No current facility-administered medications for this visit.    History   Social History  . Marital Status: Single    Spouse Name: N/A    Number of Children: N/A  . Years of Education: N/A   Occupational History  . Not on file.   Social History Main Topics  . Smoking status: Current Every Day Smoker -- 0.75 packs/day for 40 years    Types: Cigarettes    Start date: 07/13/1969  . Smokeless tobacco: Never Used     Comment: pt states quit sometime in early feb 2013  . Alcohol Use: 0.0 oz/week    0 Not specified per week     Comment: 40 oz beer/day and sometimes a bottle of wine  . Drug Use: Yes     Comment: marijuana at least weekly (last used 1 wk ago); says she's tried cocaine and has used several times in the last week - last ~ 2  days ago  . Sexual Activity: Yes   Other Topics Concern  . Not on file   Social History Narrative   Lives in [place].  Doesn't work or exercise.      Family History  Problem Relation Age of Onset  . Hypertension Mother     died in 8270's  . Diabetes Mother   . Hypertension Father     died in 7470's  . Colon cancer Brother     died in 6660's    Past Medical History  Diagnosis Date  . NSTEMI (non-ST elevated myocardial infarction)     January, 2013, bare-metal stent  to proximal LAD  . HTN (hypertension)   . Cocaine abuse   . Tobacco abuse   . Marijuana abuse   . Ejection fraction     EF 60%, echo, July 16, 2011  . Drug resistance     P2Y12 was 311 (>208) January, 2013, therefore Effient (Prasugrel) used.  . Bruising     Aspirin and Effient February, 2013  . CAD (coronary artery disease)   . Dizziness     Past Surgical History  Procedure Laterality Date  . Cesarean section      x 2    Patient Active Problem List   Diagnosis  Date Noted  . CAD (coronary artery disease)   . Dizziness   . Bruising   . HTN (hypertension)   . Tobacco abuse   . Ejection fraction   . Drug resistance   . Cocaine abuse   . Marijuana abuse     ROS   patient denies fever, chills, headache, sweats, rash, change in vision, change in hearing, chest pain, cough, nausea or vomiting, urinary symptoms. All other systems are reviewed and are negative.  PHYSICAL EXAM  patient is oriented to person time and place. Affect is normal. Head is atraumatic. Sclera and conjunctiva are normal. There is no jugulovenous distention. Lungs are clear. Respiratory effort is nonlabored. Cardiac exam reveals an S1 and S2. There are no significant murmurs. The abdomen is soft and is no peripheral edema.  Filed Vitals:   05/03/14 1307  BP: 138/86  Pulse: 63  Height: 5\' 4"  (1.626 m)  Weight: 138 lb (62.596 kg)  SpO2: 98%   EKG is done today and reviewed by me. There is normal sinus rhythm. There is question of  atrial abnormality. There is no significant change from the past.  ASSESSMENT & PLAN

## 2014-05-27 ENCOUNTER — Encounter (HOSPITAL_COMMUNITY): Payer: Self-pay | Admitting: Cardiovascular Disease

## 2014-07-22 ENCOUNTER — Other Ambulatory Visit: Payer: Self-pay | Admitting: Cardiology

## 2014-09-20 ENCOUNTER — Other Ambulatory Visit: Payer: Self-pay | Admitting: *Deleted

## 2014-09-20 MED ORDER — AMLODIPINE BESYLATE 10 MG PO TABS: 10.0000 mg | ORAL_TABLET | Freq: Every day | ORAL | Status: DC

## 2014-11-23 ENCOUNTER — Other Ambulatory Visit: Payer: Self-pay

## 2014-11-23 MED ORDER — AMLODIPINE BESYLATE 10 MG PO TABS: 10.0000 mg | ORAL_TABLET | Freq: Every day | ORAL | Status: DC

## 2015-05-09 ENCOUNTER — Ambulatory Visit (INDEPENDENT_AMBULATORY_CARE_PROVIDER_SITE_OTHER): Payer: Non-veteran care | Admitting: Cardiology

## 2015-05-09 ENCOUNTER — Encounter: Payer: Self-pay | Admitting: Cardiology

## 2015-05-09 VITALS — BP 110/80 | HR 68 | Ht 64.0 in | Wt 128.0 lb

## 2015-05-09 DIAGNOSIS — Z5181 Encounter for therapeutic drug level monitoring: Secondary | ICD-10-CM | POA: Diagnosis not present

## 2015-05-09 DIAGNOSIS — I10 Essential (primary) hypertension: Secondary | ICD-10-CM | POA: Diagnosis not present

## 2015-05-09 DIAGNOSIS — E785 Hyperlipidemia, unspecified: Secondary | ICD-10-CM | POA: Diagnosis not present

## 2015-05-09 DIAGNOSIS — I2510 Atherosclerotic heart disease of native coronary artery without angina pectoris: Secondary | ICD-10-CM

## 2015-05-09 MED ORDER — NITROGLYCERIN 0.4 MG SL SUBL: 0.4000 mg | SUBLINGUAL_TABLET | SUBLINGUAL | Status: DC | PRN

## 2015-05-09 NOTE — Patient Instructions (Signed)
Your physician recommends that you continue on your current medications as directed. Please refer to the Current Medication list given to you today. Your physician recommends that you return for a FASTING lipid/liver profile. Please have this done as soon as possible. Your physician recommends that you schedule a follow-up appointment in: 1 year. You will receive a reminder letter in the mail in about 10 months reminding you to call and schedule your appointment. If you don't receive this letter, please contact our office.

## 2015-05-09 NOTE — Progress Notes (Signed)
Cardiology Office Note  Date: 05/09/2015   ID: Melissa Mccall, DOB Feb 09, 1951, MRN [id number]  PCP: No PCP Per Patient  Primary Cardiologist: Nona Dell, MD   Chief Complaint  Patient presents with  . Coronary Artery Disease    History of Present Illness: Melissa Mccall is a 64 y.o. female former patient of Dr. Myrtis Ser, now establishing follow-up with me. This is our first meeting in clinic. She was last seen in November 2015.  I reviewed her records and updated the chart.  Ms. Ballengee lives in [place]. She does not have a primary care doctor, cannot think of the last time that she had a physical. She states that she has been taking the medications outlined below. She has not had a recent lipid panel or LFTs.  From a cardiac perspective, she denies any angina symptoms or nitroglycerin requirement. ECG today shows sinus rhythm with possible biatrial enlargement.  Blood pressure control is good today. She is on Norvasc and Cozaar.  Today we discussed her cardiac history, her medications, plan for follow-up lab work. I also strongly encouraged her to establish with a primary care provider to address ongoing health maintenance concerns and a routine physical. She states that her daughter who is a nurse in [place] has also encouraged her to do the same.  Past Medical History  Diagnosis Date  . Essential hypertension   . NSTEMI (non-ST elevated myocardial infarction) [facility]) January 2013  . Cocaine abuse   . Marijuana abuse   . Drug resistance     P2Y12 was 311 (>208) January 2013, therefore Effient (Prasugrel) used.  . Bruising     Aspirin and Effient, February 2013  . CAD (coronary artery disease)     BMS to proximal LAD 06/2011    Past Surgical History  Procedure Laterality Date  . Cesarean section      x 2  . Left heart catheterization with coronary angiogram N/A 07/16/2011    Procedure: LEFT HEART CATHETERIZATION WITH CORONARY ANGIOGRAM;  Surgeon: Kathleene Hazel, MD;  Location[facility] CATH LAB;  Service: Cardiovascular;  Laterality: N/A;  . Percutaneous coronary stent intervention (pci-s) N/A 07/16/2011    Procedure: PERCUTANEOUS CORONARY STENT INTERVENTION (PCI-S);  Surgeon: Kathleene Hazel, MD;  Location: [facility] CATH LAB;  Service: Cardiovascular;  Laterality: N/A;    Current Outpatient Prescriptions  Medication Sig Dispense Refill  . amLODipine (NORVASC) 10 MG tablet Take 1 tablet (10 mg total) by mouth daily. 30 tablet 5  . ASPIRIN LOW DOSE 81 MG EC tablet TAKE ONE TABLET EACH DAY 30 tablet 11  . atorvastatin (LIPITOR) 20 MG tablet Take 1 tablet (20 mg total) by mouth daily. 30 tablet 11  . losartan (COZAAR) 100 MG tablet Take 1 tablet (100 mg total) by mouth daily. 30 tablet 11  . nitroGLYCERIN (NITROSTAT) 0.4 MG SL tablet Place 1 tablet (0.4 mg total) under the tongue every 5 (five) minutes as needed for chest pain (Up to 3 doses). 25 tablet 3   No current facility-administered medications for this visit.    Allergies:  Review of patient's allergies indicates no known allergies.   Social History: The patient  reports that she has been smoking Cigarettes.  She started smoking about 45 years ago. She has a 30 pack-year smoking history. She has never used smokeless tobacco. She reports that she drinks alcohol. She reports that she uses illicit drugs (Marijuana).   Family History: The patient's family history includes Colon cancer in her brother; Diabetes  in her mother; Hypertension in her father and mother.   ROS:  Please see the history of present illness. Otherwise, complete review of systems is positive for none.  All other systems are reviewed and negative.   Physical Exam: VS:  BP 110/80 mmHg  Pulse 68  Ht 5\' 4"  (1.626 m)  Wt 128 lb (58.06 kg)  BMI 21.96 kg/m2  SpO2 98%, BMI Body mass index is 21.96 kg/(m^2).  Wt Readings from Last 3 Encounters:  05/09/15 128 lb (58.06 kg)  05/03/14 138 lb (62.596 kg)  05/04/13 134 lb 1.9 oz  (60.836 kg)     General: Patient appears comfortable at rest. HEENT: Conjunctiva and lids normal, oropharynx clear. Neck: Supple, possible goiter, no elevated JVP or carotid bruits. Lungs: Clear to auscultation, nonlabored breathing at rest. Cardiac: Regular rate and rhythm, no S3 or significant systolic murmur, no pericardial rub. Abdomen: Soft, nontender, bowel sounds present, no guarding or rebound. Extremities: No pitting edema, distal pulses 2+. Skin: Warm and dry. Musculoskeletal: No kyphosis. Neuropsychiatric: Alert and oriented x3, affect grossly appropriate.   ECG:  Tracing from 05/03/2014 showed normal sinus rhythm with possible biatrial enlargement.   Recent Labwork:    Component Value Date/Time   CHOL 189 07/15/2011 0525   TRIG 109 07/15/2011 0525   HDL 67 07/15/2011 0525   CHOLHDL 2.8 07/15/2011 0525   VLDL 22 07/15/2011 0525   LDLCALC 100* 07/15/2011 0525  September 2014: Potassium 4.0, BUN 18, creatinine 0.79,   Other Studies Reviewed Today:  Echocardiogram 07/16/2011: Study Conclusions  Left ventricle: Technically difficult study. The cavity size was normal. Wall thickness was increased in a pattern of moderate LVH. The estimated ejection fraction was 60%. Wall motion was normal; there were no regional wall motion abnormalities.  Assessment and Plan:  1. Symptomatically stable CAD status post BMS to the LAD in 2013. ECG reviewed is nonspecific overall. She does not report any angina on medical therapy. We will continue with strategy of observation on medical therapy for now.  2. Essential hypertension, blood pressure is low controlled today on Norvasc and Cozaar.  3. History of hyperlipidemia. She reports compliance with Lipitor. We will obtain follow-up FLP and LFTs.  4. Tobacco abuse. Smoking cessation discussed.  5. Health maintenance. Strongly encouraged to establish with a primary care provider, list of local providers given to her.  Current  medicines were reviewed with the patient today.   Orders Placed This Encounter  Procedures  . Hepatic function panel  . Lipid panel  . EKG 12-Lead    Disposition: FU with me in 1 year.   Signed, Jonelle SidleSamuel G. McDowell, MD, [facility] 05/09/2015 11:12 AM    [facility] [address], [place], [place] [address] Phone: [phone]; Fax: [phone]

## 2015-05-31 ENCOUNTER — Other Ambulatory Visit: Payer: Self-pay | Admitting: Cardiology

## 2015-05-31 MED ORDER — LOSARTAN POTASSIUM 100 MG PO TABS: 100.0000 mg | ORAL_TABLET | Freq: Every day | ORAL | Status: DC

## 2015-05-31 MED ORDER — ATORVASTATIN CALCIUM 20 MG PO TABS: 20.0000 mg | ORAL_TABLET | Freq: Every day | ORAL | Status: DC

## 2015-05-31 NOTE — Telephone Encounter (Signed)
Needs refill on Lipitor 20mg  & Cozaar 100. [facility] states that they have faxed over twice.

## 2015-06-27 ENCOUNTER — Other Ambulatory Visit: Payer: Self-pay | Admitting: Cardiology

## 2015-07-27 ENCOUNTER — Other Ambulatory Visit: Payer: Self-pay | Admitting: Cardiology

## 2015-08-30 ENCOUNTER — Encounter: Payer: Self-pay | Admitting: *Deleted

## 2015-09-06 ENCOUNTER — Telehealth: Payer: Self-pay | Admitting: Cardiology

## 2015-09-06 NOTE — Telephone Encounter (Signed)
Mrs. Melissa Mccall called the office stating that she received a letter in reference to her labs.  She will try and get it done the 1st of April due to lack of transporation.

## 2015-09-07 NOTE — Telephone Encounter (Signed)
Noted  

## 2016-01-18 ENCOUNTER — Other Ambulatory Visit: Payer: Self-pay | Admitting: Cardiology

## 2016-01-26 ENCOUNTER — Other Ambulatory Visit: Payer: Self-pay | Admitting: Cardiology

## 2016-03-01 ENCOUNTER — Other Ambulatory Visit: Payer: Self-pay | Admitting: Cardiology

## 2016-03-28 ENCOUNTER — Other Ambulatory Visit: Payer: Self-pay | Admitting: *Deleted

## 2016-03-28 DIAGNOSIS — I10 Essential (primary) hypertension: Secondary | ICD-10-CM

## 2016-03-30 ENCOUNTER — Telehealth: Payer: Self-pay | Admitting: *Deleted

## 2016-03-30 NOTE — Telephone Encounter (Signed)
Patient informed. 

## 2016-03-30 NOTE — Telephone Encounter (Signed)
-----   Message from Samuel G McDowell, MD sent at 03/29/2016 12:37 PM EDT ----- Results reviewed. Creatinine is normal at 0.95 and potassium is normal at 4.2. Lipids reviewed, HDL is 71 and LDL 97. Continue current dose Lipitor for now. A copy of this test should be forwarded to No PCP Per Patient. 

## 2016-03-30 NOTE — Telephone Encounter (Signed)
-----   Message from Jonelle SidleSamuel G McDowell, MD sent at 03/29/2016 12:37 PM EDT ----- Results reviewed. Creatinine is normal at 0.95 and potassium is normal at 4.2. Lipids reviewed, HDL is 71 and LDL 97. Continue current dose Lipitor for now. A copy of this test should be forwarded to No PCP Per Patient.

## 2016-04-20 ENCOUNTER — Telehealth: Payer: Self-pay | Admitting: Cardiology

## 2016-04-20 NOTE — Telephone Encounter (Signed)
Left message to return call 

## 2016-04-20 NOTE — Telephone Encounter (Signed)
Wants to speak with nurse about her medication  losartan (COZAAR) 100 MG tablet   And how it is affecting her BP

## 2016-04-23 NOTE — Telephone Encounter (Signed)
Would hold Cozaar for now and keep check on BP daily. Depending on trend we may start back at 25-50 mg daily.

## 2016-04-23 NOTE — Telephone Encounter (Signed)
Called in reference to her call last Friday.

## 2016-04-23 NOTE — Telephone Encounter (Signed)
Pt daughter says BP running low - 80s-90s systolic (didn't know diastolic readings)  with pt c/o dizziness - happened several times with BP getting low and dizziness - was giving her 50 mg losartan when pt c/o dizziness - daughter wants parameters of BP on when or if she needs to decrease losartan or amlodipine

## 2016-04-23 NOTE — Progress Notes (Deleted)
Cardiology Office Note  Date: 04/23/2016   ID: Melissa BonesCelestia Mccall, DOB [birth year], MRN [id number]  PCP: No PCP Per Patient  Primary Cardiologist: Nona DellSamuel Robbie Rideaux, MD   No chief complaint on file.   History of Present Illness: Melissa Mccall is a 65 y.o. female last seen in November 2016.  Past Medical History:  Diagnosis Date  . Bruising    Aspirin and Effient, February 2013  . CAD (coronary artery disease)    BMS to proximal LAD 06/2011  . Cocaine abuse   . Drug resistance    P2Y12 was 311 (>208) January 2013, therefore Effient (Prasugrel) used.  . Essential hypertension   . Marijuana abuse   . NSTEMI (non-ST elevated myocardial infarction) [facility]) January 2013    Past Surgical History:  Procedure Laterality Date  . CESAREAN SECTION     x 2  . LEFT HEART CATHETERIZATION WITH CORONARY ANGIOGRAM N/A 07/16/2011   Procedure: LEFT HEART CATHETERIZATION WITH CORONARY ANGIOGRAM;  Surgeon: Kathleene Hazelhristopher D [name], MD;  [facility];  Service: Cardiovascular;  Laterality: N/A;  . PERCUTANEOUS CORONARY STENT INTERVENTION (PCI-S) N/A 07/16/2011   Procedure: PERCUTANEOUS CORONARY STENT INTERVENTION (PCI-S);  Surgeon: Kathleene Hazelhristopher D [name], MD;  [facility];  Service: Cardiovascular;  Laterality: N/A;    Current Outpatient Prescriptions  Medication Sig Dispense Refill  . amLODipine (NORVASC) 10 MG tablet TAKE ONE TABLET EACH DAY 30 tablet 3  . aspirin (ASPIRIN LOW DOSE) 81 MG EC tablet Take 1 tablet (81 mg total) by mouth daily. 90 tablet 0  . atorvastatin (LIPITOR) 20 MG tablet Take 1 tablet (20 mg total) by mouth daily. 30 tablet 11  . losartan (COZAAR) 100 MG tablet Take 1 tablet (100 mg total) by mouth daily. 30 tablet 11  . nitroGLYCERIN (NITROSTAT) 0.4 MG SL tablet Place 1 tablet (0.4 mg total) under the tongue every 5 (five) minutes as needed for chest pain (Up to 3 doses). 25 tablet 3   No current facility-administered medications for this visit.     Allergies:  Patient has no known allergies.   Social History: The patient  reports that she has been smoking Cigarettes.  She started smoking about 46 years ago. She has a 30.00 pack-year smoking history. She has never used smokeless tobacco. She reports that she drinks alcohol. She reports that she uses drugs, including Marijuana.   Family History: The patient's family history includes Colon cancer in her brother; Diabetes in her mother; Hypertension in her father and mother.   ROS:  Please see the history of present illness. Otherwise, complete review of systems is positive for {NONE DEFAULTED:18576::"none"}.  All other systems are reviewed and negative.   Physical Exam: VS:  There were no vitals taken for this visit., BMI There is no height or weight on file to calculate BMI.  Wt Readings from Last 3 Encounters:  05/09/15 128 lb (58.1 kg)  05/03/14 138 lb (62.6 kg)  05/04/13 134 lb 1.9 oz (60.8 kg)    General: Patient appears comfortable at rest. HEENT: Conjunctiva and lids normal, oropharynx clear. Neck: Supple, possible goiter, no elevated JVP or carotid bruits. Lungs: Clear to auscultation, nonlabored breathing at rest. Cardiac: Regular rate and rhythm, no S3 or significant systolic murmur, no pericardial rub. Abdomen: Soft, nontender, bowel sounds present, no guarding or rebound. Extremities: No pitting edema, distal pulses 2+. Skin: Warm and dry. Musculoskeletal: No kyphosis. Neuropsychiatric: Alert and oriented x3, affect grossly appropriate.  ECG: I personally reviewed the tracing from  03/21/2016 which showed sinus rhythm with left atrial enlargement.  Recent Labwork:  October 2017: BUN 22, creatinine 0.95, triglycerides 152, cholesterol 198, HDL 71, LDL 97, potassium 4.2  Other Studies Reviewed Today:  Echocardiogram 07/16/2011: Study Conclusions  Left ventricle: Technically difficult study. The cavity size was normal. Wall thickness was increased in a pattern  of moderate LVH. The estimated ejection fraction was 60%. Wall motion was normal; there were no regional wall motion abnormalities.  Assessment and Plan:    Current medicines were reviewed with the patient today.  No orders of the defined types were placed in this encounter.   Disposition:  Signed, Jonelle SidleSamuel G. Cadel Stairs, MD, [facility] 04/23/2016 12:54 PM    [facility] [address], [place], [place] [address] Phone: [phone]; Fax: [phone]

## 2016-04-24 ENCOUNTER — Ambulatory Visit: Payer: Non-veteran care | Admitting: Cardiology

## 2016-04-24 NOTE — Telephone Encounter (Signed)
Left message to return call 

## 2016-04-25 MED ORDER — LOSARTAN POTASSIUM 100 MG PO TABS: 100.0000 mg | ORAL_TABLET | Freq: Every day | ORAL | Status: DC

## 2016-04-25 NOTE — Telephone Encounter (Signed)
Patient called [facility] that [facility].

## 2016-04-25 NOTE — Telephone Encounter (Signed)
Daughter Boneta Lucks(Celeisha) notified.

## 2016-05-22 ENCOUNTER — Other Ambulatory Visit: Payer: Self-pay | Admitting: Cardiology

## 2016-06-06 ENCOUNTER — Other Ambulatory Visit: Payer: Self-pay | Admitting: Cardiology

## 2016-06-26 ENCOUNTER — Other Ambulatory Visit: Payer: Self-pay | Admitting: Cardiology

## 2016-07-03 ENCOUNTER — Ambulatory Visit: Payer: Non-veteran care | Admitting: Cardiology

## 2016-07-26 NOTE — Progress Notes (Deleted)
Cardiology Office Note  Date: 07/26/2016   ID: Melissa Mccall, DOB 10/18/1950, MRN [id number]  PCP: No PCP Per Patient  Primary Cardiologist: Rozann Lesches, MD   No chief complaint on file.   History of Present Illness: Melissa Mccall is a 66 y.o. female that I met back in November 2016, a former patient of Dr. Ron Parker.  Past Medical History:  Diagnosis Date  . Bruising    Aspirin and Effient, February 2013  . CAD (coronary artery disease)    BMS to proximal LAD 06/2011  . Cocaine abuse   . Drug resistance    P2Y12 was 311 (>208) January 2013, therefore Effient (Prasugrel) used.  . Essential hypertension   . Marijuana abuse   . NSTEMI (non-ST elevated myocardial infarction) [facility]) January 2013    Past Surgical History:  Procedure Laterality Date  . CESAREAN SECTION     x 2  . LEFT HEART CATHETERIZATION WITH CORONARY ANGIOGRAM N/A 07/16/2011   Procedure: LEFT HEART CATHETERIZATION WITH CORONARY ANGIOGRAM;  Surgeon: Burnell Blanks, MD;  Location: [facility] CATH LAB;  Service: Cardiovascular;  Laterality: N/A;  . PERCUTANEOUS CORONARY STENT INTERVENTION (PCI-S) N/A 07/16/2011   Procedure: PERCUTANEOUS CORONARY STENT INTERVENTION (PCI-S);  Surgeon: Burnell Blanks, MD;  Location: [facility] CATH LAB;  Service: Cardiovascular;  Laterality: N/A;    Current Outpatient Prescriptions  Medication Sig Dispense Refill  . amLODipine (NORVASC) 10 MG tablet TAKE ONE TABLET EACH DAY 30 tablet 3  . ASPIRIN LOW DOSE 81 MG EC tablet TAKE ONE TABLET EACH DAY 90 tablet 0  . atorvastatin (LIPITOR) 20 MG tablet TAKE ONE TABLET BY MOUTH EACH DAY 30 tablet 1  . losartan (COZAAR) 100 MG tablet Take 1 tablet (100 mg total) by mouth daily. (04/25/16) on hold currently    . losartan (COZAAR) 100 MG tablet TAKE ONE TABLET BY MOUTH EACH DAY 30 tablet 0  . nitroGLYCERIN (NITROSTAT) 0.4 MG SL tablet Place 1 tablet (0.4 mg total) under the tongue every 5 (five) minutes as needed for chest pain (Up to 3  doses). 25 tablet 3   No current facility-administered medications for this visit.    Allergies:  Patient has no known allergies.   Social History: The patient  reports that she has been smoking Cigarettes.  She started smoking about 47 years ago. She has a 30.00 pack-year smoking history. She has never used smokeless tobacco. She reports that she drinks alcohol. She reports that she uses drugs, including Marijuana.   Family History: The patient's family history includes Colon cancer in her brother; Diabetes in her mother; Hypertension in her father and mother.   ROS:  Please see the history of present illness. Otherwise, complete review of systems is positive for {NONE DEFAULTED:18576::"none"}.  All other systems are reviewed and negative.   Physical Exam: VS:  There were no vitals taken for this visit., BMI There is no height or weight on file to calculate BMI.  Wt Readings from Last 3 Encounters:  05/09/15 128 lb (58.1 kg)  05/03/14 138 lb (62.6 kg)  05/04/13 134 lb 1.9 oz (60.8 kg)    General: Patient appears comfortable at rest. HEENT: Conjunctiva and lids normal, oropharynx clear. Neck: Supple, possible goiter, no elevated JVP or carotid bruits. Lungs: Clear to auscultation, nonlabored breathing at rest. Cardiac: Regular rate and rhythm, no S3 or significant systolic murmur, no pericardial rub. Abdomen: Soft, nontender, bowel sounds present, no guarding or rebound. Extremities: No pitting edema, distal pulses 2+. Skin: Warm  and dry. Musculoskeletal: No kyphosis. Neuropsychiatric: Alert and oriented x3, affect grossly appropriate.  ECG: I personally reviewed the tracing from 03/21/2016 which showed sinus rhythm with left atrial enlargement and nonspecific ST changes.  Recent Labwork:  October 2017: BUN 22, creatinine 0.9, cholesterol 198, triglycerides 152, HDL 71, LDL 97, potassium 4.2  Other Studies Reviewed Today:  Echocardiogram 07/16/2011: Study Conclusions  Left  ventricle: Technically difficult study. The cavity size was normal. Wall thickness was increased in a pattern of moderate LVH. The estimated ejection fraction was 60%. Wall motion was normal; there were no regional wall motion abnormalities.   Assessment and Plan:   Current medicines were reviewed with the patient today.  No orders of the defined types were placed in this encounter.   Disposition:  Signed, Satira Sark, MD, [facility] 07/26/2016 9:43 AM    [facility] at [place]. [address], [place], [place] [address] Phone: [phone]; Fax: [phone]

## 2016-07-27 ENCOUNTER — Ambulatory Visit: Payer: Medicare Other | Admitting: Cardiology

## 2016-07-27 DIAGNOSIS — I10 Essential (primary) hypertension: Secondary | ICD-10-CM | POA: Diagnosis not present

## 2016-07-27 DIAGNOSIS — I2510 Atherosclerotic heart disease of native coronary artery without angina pectoris: Secondary | ICD-10-CM | POA: Diagnosis not present

## 2016-07-27 DIAGNOSIS — E785 Hyperlipidemia, unspecified: Secondary | ICD-10-CM | POA: Diagnosis not present

## 2016-07-27 DIAGNOSIS — Z1211 Encounter for screening for malignant neoplasm of colon: Secondary | ICD-10-CM | POA: Diagnosis not present

## 2016-08-17 ENCOUNTER — Other Ambulatory Visit: Payer: Self-pay | Admitting: Cardiology

## 2016-08-29 ENCOUNTER — Ambulatory Visit: Payer: Medicare Other | Admitting: Cardiology

## 2016-09-18 ENCOUNTER — Other Ambulatory Visit: Payer: Self-pay | Admitting: Cardiovascular Disease

## 2016-09-24 ENCOUNTER — Encounter: Payer: Self-pay | Admitting: *Deleted

## 2016-09-24 NOTE — Progress Notes (Signed)
Cardiology Office Note  Date: 09/25/2016   ID: Melissa Mccall, DOB 1950-11-30, MRN [id number]  PCP: Lanier Clam, MD Primary Cardiologist: Rozann Lesches, MD   Chief Complaint  Patient presents with  . Coronary Artery Disease    History of Present Illness: Melissa Mccall is a 66 y.o. female that I met for the first time back in in November 2016, a former patient of Dr. Ron Parker. She presents today with her daughter for a follow-up visit. I note that she recently established with a PCP in the [facility] clinic. She had recent lab work which is outlined below. From a cardiac perspective, she does not report any angina symptoms on a regular basis. She states she is compliant with her medications.  Current cardiac regimen includes aspirin, Norvasc, Lipitor, Cozaar, and as needed nitroglycerin glycerin. Patient's daughter states that occasionally Ms. Leggitt complains of dizziness and has orthostasis. We discussed potentially cutting back Norvasc to 5 mg daily if this becomes a recurring problem. Blood pressure is normal today.  Past Medical History:  Diagnosis Date  . Bruising    Aspirin and Effient, February 2013  . CAD (coronary artery disease)    BMS to proximal LAD 06/2011  . Cocaine abuse   . Drug resistance    P2Y12 was 311 (>208) January 2013, therefore Effient (Prasugrel) used.  . Essential hypertension   . Marijuana abuse   . NSTEMI (non-ST elevated myocardial infarction) [facility]) January 2013    Past Surgical History:  Procedure Laterality Date  . CESAREAN SECTION     x 2  . LEFT HEART CATHETERIZATION WITH CORONARY ANGIOGRAM N/A 07/16/2011   Procedure: LEFT HEART CATHETERIZATION WITH CORONARY ANGIOGRAM;  Surgeon: Burnell Blanks, MD;  Location[facility] CATH LAB;  Service: Cardiovascular;  Laterality: N/A;  . PERCUTANEOUS CORONARY STENT INTERVENTION (PCI-S) N/A 07/16/2011   Procedure: PERCUTANEOUS CORONARY STENT INTERVENTION (PCI-S);  Surgeon: Burnell Blanks,  MD;  Location: [facility] CATH LAB;  Service: Cardiovascular;  Laterality: N/A;    Current Outpatient Prescriptions  Medication Sig Dispense Refill  . amLODipine (NORVASC) 10 MG tablet TAKE ONE TABLET EACH DAY 30 tablet 1  . ASPIRIN LOW DOSE 81 MG EC tablet TAKE ONE TABLET EACH DAY 90 tablet 0  . atorvastatin (LIPITOR) 20 MG tablet TAKE ONE TABLET EACH DAY 30 tablet 0  . losartan (COZAAR) 100 MG tablet TAKE ONE TABLET BY MOUTH EACH DAY 30 tablet 1  . nitroGLYCERIN (NITROSTAT) 0.4 MG SL tablet Place 1 tablet (0.4 mg total) under the tongue every 5 (five) minutes as needed for chest pain (Up to 3 doses). 25 tablet 3   No current facility-administered medications for this visit.    Allergies:  Patient has no known allergies.   Social History: The patient  reports that she has been smoking Cigarettes.  She started smoking about 47 years ago. She has a 30.00 pack-year smoking history. She has never used smokeless tobacco. She reports that she drinks alcohol. She reports that she uses drugs, including Marijuana.   ROS:  Please see the history of present illness. Otherwise, complete review of systems is positive for none.  All other systems are reviewed and negative.   Physical Exam: VS:  BP 127/78 (BP Location: Left Arm, Patient Position: Sitting, Cuff Size: Normal)   Pulse 63   Ht 5' 4"  (1.626 m)   Wt 133 lb 6.4 oz (60.5 kg)   SpO2 98%   BMI 22.90 kg/m , BMI Body mass index is 22.9 kg/m.  Wt Readings from Last 3 Encounters:  09/25/16 133 lb 6.4 oz (60.5 kg)  05/09/15 128 lb (58.1 kg)  05/03/14 138 lb (62.6 kg)    General: Patient appears comfortable at rest. HEENT: Conjunctiva and lids normal, oropharynx clear. Neck: Supple, possible goiter, no elevated JVP or carotid bruits. Lungs: Clear to auscultation, nonlabored breathing at rest. Cardiac: Regular rate and rhythm, no S3 or significant systolic murmur, no pericardial rub. Abdomen: Soft, nontender, bowel sounds present, no guarding or  rebound. Extremities: No pitting edema, distal pulses 2+. Skin: Warm and dry. Musculoskeletal: No kyphosis. Neuropsychiatric: Alert and oriented x3, affect grossly appropriate.  ECG: I personally reviewed the tracing from 03/21/2016 which showed sinus bradycardia with possible left atrial enlargement and nonspecific ST changes.  Recent Labwork:    Component Value Date/Time   CHOL 189 07/15/2011 0525   TRIG 109 07/15/2011 0525   HDL 67 07/15/2011 0525   CHOLHDL 2.8 07/15/2011 0525   VLDL 22 07/15/2011 0525   LDLCALC 100 (H) 07/15/2011 0525  February 2018: Potassium 4.3, BUN 22, creatinine 1.2, AST 21, ALT 24, TSH 1.66  Other Studies Reviewed Today:  Echocardiogram 07/16/2011: Study Conclusions  Left ventricle: Technically difficult study. The cavity size was normal. Wall thickness was increased in a pattern of moderate LVH. The estimated ejection fraction was 60%. Wall motion was normal; there were no regional wall motion abnormalities.  Assessment and Plan:  1. CAD status post BMS to the LAD in 2013. She remains symptomatic with stable without progressive angina symptoms on medical therapy. No changes were made today.  2. Essential hypertension, blood pressure is well controlled today on Norvasc and Cozaar. If she has recurring episodes of lightheadedness or orthostasis, would cut Norvasc back to 5 mg daily.  3. Hyperlipidemia, continues on Lipitor with follow-up per PCP. Last LDL was 100.  4. Tobacco abuse. Smoking has been cessation discussed. She has not been able to quit.  Current medicines were reviewed with the patient today.   Disposition: Follow-up in one year, sooner if needed.  Signed, Satira Sark, MD, [facility] 09/25/2016 9:03 AM    [facility] [place], [place], [place] [address] Phone: [phone]; Fax: [phone]

## 2016-09-25 ENCOUNTER — Encounter: Payer: Self-pay | Admitting: Cardiology

## 2016-09-25 ENCOUNTER — Ambulatory Visit (INDEPENDENT_AMBULATORY_CARE_PROVIDER_SITE_OTHER): Payer: Medicare Other | Admitting: Cardiology

## 2016-09-25 VITALS — BP 127/78 | HR 63 | Ht 64.0 in | Wt 133.4 lb

## 2016-09-25 DIAGNOSIS — I10 Essential (primary) hypertension: Secondary | ICD-10-CM

## 2016-09-25 DIAGNOSIS — I25119 Atherosclerotic heart disease of native coronary artery with unspecified angina pectoris: Secondary | ICD-10-CM

## 2016-09-25 DIAGNOSIS — Z720 Tobacco use: Secondary | ICD-10-CM

## 2016-09-25 DIAGNOSIS — E782 Mixed hyperlipidemia: Secondary | ICD-10-CM

## 2016-09-25 DIAGNOSIS — I209 Angina pectoris, unspecified: Secondary | ICD-10-CM

## 2016-09-25 NOTE — Patient Instructions (Signed)

## 2016-11-08 ENCOUNTER — Other Ambulatory Visit: Payer: Self-pay | Admitting: Cardiology

## 2016-11-19 ENCOUNTER — Other Ambulatory Visit: Payer: Self-pay | Admitting: Cardiology

## 2017-10-01 NOTE — Progress Notes (Signed)
Cardiology Office Note  Date: 10/02/2017   ID: Melissa BonesCelestia Sermeno, DOB [birth year], MRN [id number]  PCP: Remigio EisenmengerBalakrishnan, Shyam E, MD  Primary Cardiologist: Nona DellSamuel McDowell, MD   Chief Complaint  Patient presents with  . Coronary Artery Disease    History of Present Illness: Melissa Mccall is a 67 y.o. female last seen in April 2018.  She presents for a routine visit.  Reports no angina symptoms or increasing shortness of breath with typical ADLs over the last year.  She states that she lives alone, does her own cooking.  She does have a daughter that looks in on her.  We went over her medications.  She states that she has trouble remembering to take her Lipitor in the evenings and has not been consistent with this.  She has also not had a recent lipid panel.  I asked her to take Lipitor in the morning with her other medications so that she can remember, also recommended to follow-up fasting lipid panel which she states that she will get with her PCP.  I personally reviewed her ECG today which shows normal sinus rhythm.  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  Past Medical History:  Diagnosis Date  . Bruising    Aspirin and Effient, February 2013  . CAD (coronary artery disease)    BMS to proximal LAD 06/2011  . Cocaine abuse (HCC)   . Drug resistance    P2Y12 was 311 (>208) January 2013, therefore Effient (Prasugrel) used.  . Essential hypertension   . Marijuana abuse   . NSTEMI (non-ST elevated myocardial infarction) [facility](HCC) January 2013    Past Surgical History:  Procedure Laterality Date  . CESAREAN SECTION     x 2  . LEFT HEART CATHETERIZATION WITH CORONARY ANGIOGRAM N/A 07/16/2011   Procedure: LEFT HEART CATHETERIZATION WITH CORONARY ANGIOGRAM;  Surgeon: Kathleene Hazelhristopher D [name], MD;  [facility];  Service: Cardiovascular;  Laterality: N/A;  . PERCUTANEOUS CORONARY STENT INTERVENTION (PCI-S) N/A  07/16/2011   Procedure: PERCUTANEOUS CORONARY STENT INTERVENTION (PCI-S);  Surgeon: Kathleene Hazelhristopher D [name], MD;  [facility];  Service: Cardiovascular;  Laterality: N/A;    Current Outpatient Medications  Medication Sig Dispense Refill  . amLODipine (NORVASC) 10 MG tablet TAKE ONE TABLET EACH DAY 30 tablet 11  . ASPIRIN LOW DOSE 81 MG EC tablet TAKE ONE TABLET EACH DAY 90 tablet 0  . atorvastatin (LIPITOR) 20 MG tablet TAKE ONE TABLET EACH DAY 30 tablet 6  . losartan (COZAAR) 100 MG tablet TAKE ONE TABLET BY MOUTH EACH DAY 30 tablet 11  . nitroGLYCERIN (NITROSTAT) 0.4 MG SL tablet Place 1 tablet (0.4 mg total) under the tongue every 5 (five) minutes as needed for chest pain (Up to 3 doses). 25 tablet 3   No current facility-administered medications for this visit.    Allergies:  Patient has no known allergies.   Social History: The patient  reports that she has been smoking cigarettes.  She started smoking about 48 years ago. She has a 30.00 pack-year smoking history. She has never used smokeless tobacco. She reports that she drinks alcohol. She reports that she has current or past drug history. Drug: Marijuana.   ROS:  Please see the history of present illness. Otherwise, complete review of systems is positive for none.  All other systems are reviewed and negative.   Physical Exam: VS:  BP 124/87   Pulse 81   Wt  129 lb (58.5 kg)   SpO2 100%   BMI 22.14 kg/m , BMI Body mass index is 22.14 kg/m.  Wt Readings from Last 3 Encounters:  10/02/17 129 lb (58.5 kg)  09/25/16 133 lb 6.4 oz (60.5 kg)  05/09/15 128 lb (58.1 kg)    General: Patient appears comfortable at rest. HEENT: Conjunctiva and lids normal, oropharynx clear. Neck: Supple, no elevated JVP or carotid bruits, goiter. Lungs: Clear to auscultation, nonlabored breathing at rest. Cardiac: Regular rate and rhythm, no S3 or significant systolic murmur. Abdomen: Soft, nontender, bowel sounds present. Extremities: No  pitting edema, distal pulses 2+. Skin: Warm and dry. Musculoskeletal: No kyphosis. Neuropsychiatric: Alert and oriented x3, affect grossly appropriate.  ECG: I personally reviewed the tracing from 03/21/2016 which showed sinus bradycardia with possible left atrial enlargement and nonspecific ST changes.  Recent Labwork:  February 2018: BUN 22, creatinine 1.19. potassium 4.3, AST 21, ALT 24, TSH 1.66    Other Studies Reviewed Today:  Echocardiogram 07/16/2011: Study Conclusions  Left ventricle: Technically difficult study. The cavity size was normal. Wall thickness was increased in a pattern of moderate LVH. The estimated ejection fraction was 60%. Wall motion was normal; there were no regional wall motion abnormalities.  Assessment and Plan:  1.  Single vessel obstructive CAD status post BMS to the LAD in 2013.  She remains a symptomatically without angina and her ECG is normal today.  Continue medical therapy and observation, currently on aspirin and statin.  2.  Mixed hyperlipidemia, she states that she has been inconsistent with Lipitor because she forgets to take it in the evenings.  She will begin taking this medication in the morning.  I recommended a follow-up FLP with her PCP.  3.  Tobacco abuse.  Smoking cessation reviewed.  She has not been consistently motivated to quit  4.  Essential hypertension, blood pressure well controlled today.  Keep follow-up with PCP.  Current medicines were reviewed with the patient today.   Orders Placed This Encounter  Procedures  . EKG 12-Lead    Disposition: Follow-up in 1 year.  Signed, Jonelle Sidle, MD, [facility] 10/02/2017 10:43 AM    [facility] at [facility] [address] [place], [place], [place] [address] Phone: [phone]; Fax: [phone]

## 2017-10-02 ENCOUNTER — Ambulatory Visit (INDEPENDENT_AMBULATORY_CARE_PROVIDER_SITE_OTHER): Payer: Medicare Other | Admitting: Cardiology

## 2017-10-02 ENCOUNTER — Other Ambulatory Visit: Payer: Self-pay | Admitting: Cardiology

## 2017-10-02 ENCOUNTER — Encounter: Payer: Self-pay | Admitting: Cardiology

## 2017-10-02 VITALS — BP 124/87 | HR 81 | Wt 129.0 lb

## 2017-10-02 DIAGNOSIS — E782 Mixed hyperlipidemia: Secondary | ICD-10-CM | POA: Diagnosis not present

## 2017-10-02 DIAGNOSIS — I25119 Atherosclerotic heart disease of native coronary artery with unspecified angina pectoris: Secondary | ICD-10-CM

## 2017-10-02 DIAGNOSIS — I10 Essential (primary) hypertension: Secondary | ICD-10-CM | POA: Diagnosis not present

## 2017-10-02 DIAGNOSIS — Z720 Tobacco use: Secondary | ICD-10-CM | POA: Diagnosis not present

## 2017-10-02 MED ORDER — NITROGLYCERIN 0.4 MG SL SUBL: 0.4000 mg | SUBLINGUAL_TABLET | SUBLINGUAL | 3 refills | Status: DC | PRN

## 2017-10-02 NOTE — Patient Instructions (Signed)

## 2017-11-19 ENCOUNTER — Other Ambulatory Visit: Payer: Self-pay | Admitting: Cardiology

## 2017-12-24 ENCOUNTER — Other Ambulatory Visit: Payer: Self-pay | Admitting: Cardiology

## 2017-12-24 ENCOUNTER — Telehealth: Payer: Self-pay | Admitting: Cardiology

## 2017-12-24 MED ORDER — LOSARTAN POTASSIUM 100 MG PO TABS: ORAL_TABLET | ORAL | 0 refills | Status: DC

## 2017-12-24 NOTE — Telephone Encounter (Signed)
Returning someones call 

## 2017-12-24 NOTE — Telephone Encounter (Signed)
[facility] [place] has losartan in stock. Refill sent there.

## 2017-12-24 NOTE — Telephone Encounter (Signed)
Regarding pt Losartan is on backorder until August - [facility] and also on backorder olmesartan and irbesartan - will forward to provider on what to replace with

## 2018-01-15 ENCOUNTER — Other Ambulatory Visit: Payer: Self-pay | Admitting: Cardiology

## 2018-01-17 ENCOUNTER — Telehealth: Payer: Self-pay | Admitting: Cardiology

## 2018-01-17 NOTE — Telephone Encounter (Signed)
Patient called stating that she needs to speak with someone in regards to her BP medications.

## 2018-01-17 NOTE — Telephone Encounter (Signed)
Pt was actually calling to confirm refill for losartan and was sent on 7/31 #90

## 2018-01-17 NOTE — Telephone Encounter (Signed)
No answer no VM

## 2018-06-19 ENCOUNTER — Other Ambulatory Visit: Payer: Self-pay | Admitting: Cardiology

## 2018-07-14 ENCOUNTER — Other Ambulatory Visit: Payer: Self-pay | Admitting: Cardiology

## 2018-07-16 ENCOUNTER — Other Ambulatory Visit: Payer: Self-pay | Admitting: Cardiology

## 2018-09-30 ENCOUNTER — Telehealth: Payer: Self-pay | Admitting: *Deleted

## 2018-09-30 NOTE — Telephone Encounter (Signed)
Pt verbalized consent for telehealth appt with Dr Diona Browner via WebEx and that insurance would be billed for the encounter. Medications/allergies/pharmacy reviewed. No recent hospital. Pt will have BP/HR/weight available.

## 2018-10-03 NOTE — Progress Notes (Signed)
Virtual Visit via Video Note   This visit type was conducted due to national recommendations for restrictions regarding the COVID-19 Pandemic (e.g. social distancing) in an effort to limit this patient's exposure and mitigate transmission in our community.  Due to her co-morbid illnesses, this patient is at least at moderate risk for complications without adequate follow up.  This format is felt to be most appropriate for this patient at this time.  All issues noted in this document were discussed and addressed.  A limited physical exam was performed with this format.  Please refer to the patient's chart for her consent to telehealth for [facility].   Evaluation Performed:  Follow-up visit  Date:  10/06/2018   ID:  Melissa Mccall, DOB [birth year], MRN [id number]  Patient Location: Home Provider Location: Office  PCP:  Remigio Eisenmenger, MD  Cardiologist:  Jonelle Sidle, MD  Chief Complaint: Follow-up CAD and symptom control  History of Present Illness:    Melissa Mccall is a 68 y.o. female last seen in April 2019.  I communicated with her via video conferencing today, her daughter who is a nurse was also present.  She tells me that she has had no angina symptoms, no nitroglycerin use in the last year.  She has been staying at home during the COVID-19 pandemic.  She reports NYHA class II dyspnea with typical house chores.  No palpitations or syncope.  Sometimes feels dizzy when she stands up quickly, but has had no syncope.  I reviewed her medications which are outlined below.  She reports compliance and no obvious intolerances.  We did discuss updating her nitroglycerin refill.  We have managed her medically with history of CAD status post BMS to proximal LAD in 2013.  The patient does not have symptoms concerning for COVID-19 infection (fever, chills, cough, or new shortness of breath).    Past Medical History:  Diagnosis Date  . Bruising    Aspirin and Effient,  February 2013  . CAD (coronary artery disease)    BMS to proximal LAD 06/2011  . Cocaine abuse (HCC)   . Drug resistance    P2Y12 was 311 (>208) January 2013, therefore Effient (Prasugrel) used.  . Essential hypertension   . Marijuana abuse   . NSTEMI (non-ST elevated myocardial infarction) [facility]) January 2013   Past Surgical History:  Procedure Laterality Date  . CESAREAN SECTION     x 2  . LEFT HEART CATHETERIZATION WITH CORONARY ANGIOGRAM N/A 07/16/2011   Procedure: LEFT HEART CATHETERIZATION WITH CORONARY ANGIOGRAM;  Surgeon: Kathleene Hazel, MD;  Location: [facility] CATH LAB;  Service: Cardiovascular;  Laterality: N/A;  . PERCUTANEOUS CORONARY STENT INTERVENTION (PCI-S) N/A 07/16/2011   Procedure: PERCUTANEOUS CORONARY STENT INTERVENTION (PCI-S);  Surgeon: Kathleene Hazel, MD;  Location: [facility] CATH LAB;  Service: Cardiovascular;  Laterality: N/A;     Current Meds  Medication Sig  . amLODipine (NORVASC) 10 MG tablet TAKE ONE TABLET EACH DAY  . ASPIRIN LOW DOSE 81 MG EC tablet TAKE ONE TABLET EACH DAY  . atorvastatin (LIPITOR) 20 MG tablet TAKE ONE TABLET EACH DAY  . losartan (COZAAR) 100 MG tablet TAKE 1 TABLET BY MOUTH EVERY DAY  . nitroGLYCERIN (NITROSTAT) 0.4 MG SL tablet Place 1 tablet (0.4 mg total) under the tongue every 5 (five) minutes as needed for chest pain (Up to 3 doses).  . [DISCONTINUED] nitroGLYCERIN (NITROSTAT) 0.4 MG SL tablet Place 1 tablet (0.4 mg total) under the tongue every 5 (five) minutes  as needed for chest pain (Up to 3 doses).     Allergies:   Patient has no known allergies.   Social History   Tobacco Use  . Smoking status: Current Every Day Smoker    Packs/day: 0.75    Years: 40.00    Pack years: 30.00    Types: Cigarettes    Start date: 07/13/1969  . Smokeless tobacco: Never Used  . Tobacco comment: pt states quit sometime in early feb 2013  Substance Use Topics  . Alcohol use: Yes    Alcohol/week: 0.0 standard drinks    Comment: 40 oz  beer/day and sometimes a bottle of wine  . Drug use: Yes    Types: Marijuana     Family Hx: The patient's family history includes Colon cancer in her brother; Diabetes in her mother; Hypertension in her father and mother.  ROS:   Please see the history of present illness.    All other systems reviewed and are negative.   Prior CV studies:   The following studies were reviewed today:  Echocardiogram 07/16/2011: Study Conclusions  Left ventricle: Technically difficult study. The cavity size was normal. Wall thickness was increased in a pattern of moderate LVH. The estimated ejection fraction was 60%. Wall motion was normal; there were no regional wall motion abnormalities.  Labs/Other Tests and Data Reviewed:    EKG:  An ECG dated 10/02/2017 was personally reviewed today and demonstrated:  Normal sinus rhythm.  Recent Labs:  No interval lab work for review.  Wt Readings from Last 3 Encounters:  10/02/17 129 lb (58.5 kg)  09/25/16 133 lb 6.4 oz (60.5 kg)  05/09/15 128 lb (58.1 kg)     Objective:    Vital Signs:  BP 138/83   Pulse 79   Ht 5\' 4"  (1.626 m)   BMI 22.14 kg/m    General: Appears comfortable at rest, sitting at kitchen table. HEENT: Conjunctiva and lids normal. Skin: Normal appearing turgor. Lungs: Patient was not breathless speaking in full sentences.  No cough or wheezing evident during conversation. Neuropsychiatric: Affect appropriate.  Speech pattern normal.  She answers questions spontaneously.   ASSESSMENT & PLAN:    1.  CAD with history of BMS to the LAD in 2013.  She remains stable without active angina symptoms on medical therapy, no nitroglycerin use.  Remains functional with activities and ADLs.  Refill provided for fresh bottle of nitroglycerin.  We will obtain a follow-up ECG for next office visit.  2.  Essential hypertension, systolic in the 130s today.  She continues on Norvasc and Cozaar.  3.  Mixed hyperlipidemia, on Lipitor.  She has  established with a new PCP.  I asked her to have her next set of lab work with physical sent to our office for review.  4.  Tobacco abuse.  Smoking cessation has been discussed over time.  She has not been able to quit as yet.  COVID-19 Education: The signs and symptoms of COVID-19 were discussed with the patient and how to seek care for testing (follow up with PCP or arrange E-visit).  The importance of social distancing was discussed today.  Time:   Today, I have spent 9 minutes with the patient with telehealth technology discussing the above problems.     Medication Adjustments/Labs and Tests Ordered: Current medicines are reviewed at length with the patient today.  Concerns regarding medicines are outlined above.   Tests Ordered: No orders of the defined types were placed in this encounter.  Medication Changes: Meds ordered this encounter  Medications  . nitroGLYCERIN (NITROSTAT) 0.4 MG SL tablet    Sig: Place 1 tablet (0.4 mg total) under the tongue every 5 (five) minutes as needed for chest pain (Up to 3 doses).    Dispense:  25 tablet    Refill:  3    Disposition:  Follow up 6 months  Signed, Nona DellSamuel McDowell, MD  10/06/2018 10:19 AM    [facility]

## 2018-10-06 ENCOUNTER — Telehealth (INDEPENDENT_AMBULATORY_CARE_PROVIDER_SITE_OTHER): Payer: Medicare Other | Admitting: Cardiology

## 2018-10-06 ENCOUNTER — Encounter: Payer: Self-pay | Admitting: Cardiology

## 2018-10-06 VITALS — BP 138/83 | HR 79 | Ht 64.0 in

## 2018-10-06 DIAGNOSIS — Z720 Tobacco use: Secondary | ICD-10-CM | POA: Diagnosis not present

## 2018-10-06 DIAGNOSIS — I25119 Atherosclerotic heart disease of native coronary artery with unspecified angina pectoris: Secondary | ICD-10-CM

## 2018-10-06 DIAGNOSIS — Z7189 Other specified counseling: Secondary | ICD-10-CM | POA: Diagnosis not present

## 2018-10-06 DIAGNOSIS — E782 Mixed hyperlipidemia: Secondary | ICD-10-CM

## 2018-10-06 DIAGNOSIS — I10 Essential (primary) hypertension: Secondary | ICD-10-CM | POA: Diagnosis not present

## 2018-10-06 MED ORDER — NITROGLYCERIN 0.4 MG SL SUBL: 0.4000 mg | SUBLINGUAL_TABLET | SUBLINGUAL | 3 refills | Status: DC | PRN

## 2018-10-06 NOTE — Patient Instructions (Signed)
Medication Instructions:   A new refill on your Nitroglycerin was sent to pharmacy today.  Wanted to make sure you have active refills on profile.   Continue all other medications.    Labwork: none  Testing/Procedures: none  Follow-Up: Your physician wants you to follow up in: 6 months.  You will receive a reminder letter in the mail one-two months in advance.  If you don't receive a letter, please call our office to schedule the follow up appointment   Any Other Special Instructions Will Be Listed Below (If Applicable).  If you need a refill on your cardiac medications before your next appointment, please call your pharmacy.

## 2018-11-18 ENCOUNTER — Other Ambulatory Visit: Payer: Self-pay | Admitting: Cardiology

## 2019-01-15 ENCOUNTER — Other Ambulatory Visit: Payer: Self-pay | Admitting: Cardiology

## 2019-01-20 ENCOUNTER — Other Ambulatory Visit: Payer: Self-pay | Admitting: *Deleted

## 2019-01-20 MED ORDER — LOSARTAN POTASSIUM 100 MG PO TABS: ORAL_TABLET | ORAL | 3 refills | Status: DC

## 2019-03-20 ENCOUNTER — Other Ambulatory Visit: Payer: Self-pay | Admitting: Cardiology

## 2019-04-15 ENCOUNTER — Telehealth (INDEPENDENT_AMBULATORY_CARE_PROVIDER_SITE_OTHER): Payer: Medicare Other | Admitting: Cardiology

## 2019-04-15 ENCOUNTER — Encounter: Payer: Self-pay | Admitting: Cardiology

## 2019-04-15 VITALS — Ht 64.0 in

## 2019-04-15 DIAGNOSIS — Z720 Tobacco use: Secondary | ICD-10-CM | POA: Diagnosis not present

## 2019-04-15 DIAGNOSIS — I10 Essential (primary) hypertension: Secondary | ICD-10-CM

## 2019-04-15 DIAGNOSIS — I25119 Atherosclerotic heart disease of native coronary artery with unspecified angina pectoris: Secondary | ICD-10-CM | POA: Diagnosis not present

## 2019-04-15 DIAGNOSIS — E782 Mixed hyperlipidemia: Secondary | ICD-10-CM | POA: Diagnosis not present

## 2019-04-15 DIAGNOSIS — Z79899 Other long term (current) drug therapy: Secondary | ICD-10-CM

## 2019-04-15 NOTE — Patient Instructions (Addendum)
Medication Instructions:   Your physician recommends that you continue on your current medications as directed. Please refer to the Current Medication list given to you today.  Labwork:  Your physician recommends that you return for a FASTING lipid/liver profile: as soon as possible. Please do not eat or drink for at least 8 hours when you have this done except you can take your medications with a sip of water. You may have this done at [facility]). Your lab orders have been mailed to you. Please take them with you to the lab.   Testing/Procedures:  NONE  Follow-Up:  Your physician recommends that you schedule a follow-up appointment in: 6 months. You will receive a reminder letter in the mail in about 4 months reminding you to call and schedule your appointment. If you don't receive this letter, please contact our office.  Any Other Special Instructions Will Be Listed Below (If Applicable).  If you need a refill on your cardiac medications before your next appointment, please call your pharmacy.

## 2019-04-15 NOTE — Progress Notes (Signed)
Virtual Visit via Telephone Note   This visit type was conducted due to national recommendations for restrictions regarding the COVID-19 Pandemic (e.g. social distancing) in an effort to limit this patient's exposure and mitigate transmission in our community.  Due to her co-morbid illnesses, this patient is at least at moderate risk for complications without adequate follow up.  This format is felt to be most appropriate for this patient at this time.  The patient did not have access to video technology/had technical difficulties with video requiring transitioning to audio format only (telephone).  All issues noted in this document were discussed and addressed.  No physical exam could be performed with this format.  Please refer to the patient's chart for her  consent to telehealth for [facility].   Date:  04/15/2019   ID:  Melissa Mccall, DOB 05-17-1951, MRN [id number]  Patient Location: Home Provider Location: Office  PCP:  Remigio Eisenmenger, MD  Cardiologist:  Nona Dell, MD Electrophysiologist:  None   Evaluation Performed:  Follow-Up Visit  Chief Complaint:   Cardiac follow-up  History of Present Illness:    Melissa Mccall is a 68 y.o. female last assessed via telehealth encounter in April.  We spoke by phone today, also communicated with her daughter over the phone.  Ms. Copenhaver does not report any obvious angina symptoms.  She states that she has been taking her medications regularly including statin therapy which she had not been consistent with in the past.  She is due for follow-up lab work and we discussed getting this set up.  She continues to smoke cigarettes, but would like to try to quit again.  She notices wheezing when she smokes.  I talked with her about cutting back and then planning on a quit date to try again.  I reviewed her medications which are outlined below.  The patient does not have symptoms concerning for COVID-19 infection (fever, chills,  cough, or new shortness of breath).    Past Medical History:  Diagnosis Date  . Bruising    Aspirin and Effient, February 2013  . CAD (coronary artery disease)    BMS to proximal LAD 06/2011  . Cocaine abuse (HCC)   . Drug resistance    P2Y12 was 311 (>208) January 2013, therefore Effient (Prasugrel) used.  . Essential hypertension   . Marijuana abuse   . NSTEMI (non-ST elevated myocardial infarction) [facility]) January 2013   Past Surgical History:  Procedure Laterality Date  . CESAREAN SECTION     x 2  . LEFT HEART CATHETERIZATION WITH CORONARY ANGIOGRAM N/A 07/16/2011   Procedure: LEFT HEART CATHETERIZATION WITH CORONARY ANGIOGRAM;  Surgeon: Kathleene Hazel, MD;  Location: [facility] CATH LAB;  Service: Cardiovascular;  Laterality: N/A;  . PERCUTANEOUS CORONARY STENT INTERVENTION (PCI-S) N/A 07/16/2011   Procedure: PERCUTANEOUS CORONARY STENT INTERVENTION (PCI-S);  Surgeon: Kathleene Hazel, MD;  Location: [facility] CATH LAB;  Service: Cardiovascular;  Laterality: N/A;     Current Meds  Medication Sig  . amLODipine (NORVASC) 10 MG tablet TAKE ONE TABLET EACH DAY  . ASPIRIN LOW DOSE 81 MG EC tablet TAKE ONE TABLET EACH DAY  . atorvastatin (LIPITOR) 20 MG tablet TAKE ONE TABLET EACH DAY  . losartan (COZAAR) 100 MG tablet TAKE 1 TABLET BY MOUTH EVERY DAY  . nitroGLYCERIN (NITROSTAT) 0.4 MG SL tablet Place 1 tablet (0.4 mg total) under the tongue every 5 (five) minutes as needed for chest pain (Up to 3 doses).     Allergies:  Patient has no known allergies.   Social History   Tobacco Use  . Smoking status: Current Every Day Smoker    Packs/day: 0.75    Years: 40.00    Pack years: 30.00    Types: Cigarettes    Start date: 07/13/1969  . Smokeless tobacco: Never Used  . Tobacco comment: pt states quit sometime in early feb 2013  Substance Use Topics  . Alcohol use: Yes    Alcohol/week: 0.0 standard drinks    Comment: 40 oz beer/day and sometimes a bottle of wine  . Drug use: Yes     Types: Marijuana     Family Hx: The patient's family history includes Colon cancer in her brother; Diabetes in her mother; Hypertension in her father and mother.  ROS:   Please see the history of present illness. All other systems reviewed and are negative.   Prior CV studies:   The following studies were reviewed today:  Echocardiogram 07/16/2011: Study Conclusions  Left ventricle: Technically difficult study. The cavity size was normal. Wall thickness was increased in a pattern of moderate LVH. The estimated ejection fraction was 60%. Wall motion was normal; there were no regional wall motion abnormalities.  Labs/Other Tests and Data Reviewed:    EKG:  An ECG dated 10/02/2017 was personally reviewed today and demonstrated:  Normal sinus rhythm.  Recent Labs:  No interval lab work for review.  Wt Readings from Last 3 Encounters:  10/02/17 129 lb (58.5 kg)  09/25/16 133 lb 6.4 oz (60.5 kg)  05/09/15 128 lb (58.1 kg)     Objective:    Vital Signs:  Ht 5\' 4"  (1.626 m)   BMI 22.14 kg/m    She did not have a way to check vital signs today. Patient spoke in full sentences, not short of breath. No audible wheezing or coughing. Speech pattern normal.  ASSESSMENT & PLAN:    1.  CAD status post BMS to the LAD in 2013.  She does not report any active angina at this time.  I reviewed her medications, she states that she has been compliant.  No changes were made today.  2.  Tobacco abuse.  We talked about smoking cessation.  3.  Mixed hyperlipidemia, she reports compliance with Lipitor.  We will schedule follow-up FLP and LFTs.  4.  Essential hypertension, she is on Norvasc and Cozaar.  COVID-19 Education: The signs and symptoms of COVID-19 were discussed with the patient and how to seek care for testing (follow up with PCP or arrange E-visit).  The importance of social distancing was discussed today.  Time:   Today, I have spent 7 minutes with the patient with  telehealth technology discussing the above problems.     Medication Adjustments/Labs and Tests Ordered: Current medicines are reviewed at length with the patient today.  Concerns regarding medicines are outlined above.   Tests Ordered: Orders Placed This Encounter  Procedures  . Hepatic function panel  . Lipid panel    Medication Changes: No orders of the defined types were placed in this encounter.   Follow Up:  In Person 6 months in the [place] office.  Signed, Rozann Lesches, MD  04/15/2019 4:11 PM    [facility]

## 2019-05-19 ENCOUNTER — Encounter: Payer: Self-pay | Admitting: *Deleted

## 2019-05-29 DIAGNOSIS — N898 Other specified noninflammatory disorders of vagina: Secondary | ICD-10-CM | POA: Diagnosis not present

## 2019-05-29 DIAGNOSIS — Z124 Encounter for screening for malignant neoplasm of cervix: Secondary | ICD-10-CM | POA: Diagnosis not present

## 2019-07-20 ENCOUNTER — Other Ambulatory Visit: Payer: Self-pay | Admitting: Cardiology

## 2019-09-22 DIAGNOSIS — Z202 Contact with and (suspected) exposure to infections with a predominantly sexual mode of transmission: Secondary | ICD-10-CM | POA: Diagnosis not present

## 2019-09-22 DIAGNOSIS — N898 Other specified noninflammatory disorders of vagina: Secondary | ICD-10-CM | POA: Diagnosis not present

## 2019-11-09 NOTE — Progress Notes (Addendum)
Cardiology Office Note  Date: 11/10/2019   ID: Melissa Mccall, DOB 1951/06/11, MRN [id number]  PCP:  Remigio Eisenmenger, MD  Cardiologist:  Nona Dell, MD Electrophysiologist:  None   Chief Complaint: Follow-up CAD, HLD, HTN, tobacco abuse  History of Present Illness: Melissa Mccall is a 69 y.o. female with a history of  CAD (BMS to LAD 2013), HLD, HTN, tobacco abuse.  Last encounter with Dr. Diona Browner on 04/15/2019 via telemedicine.  At that time she did not report any obvious anginal symptoms she had been taking her medications regularly including her statin therapy which she had not been consistent with in the past.  She was due for follow-up lab work.  She continues to smoke but was trying to quit again.  She continued on her antihypertensive medicine including Norvasc and Cozaar.  Follow-up FLP and LFTs were ordered.  She did not follow-up with having the labs drawn.  Today she presents with significant exertional fatigue and episodes of chest pain with and without exertion.  She had a recent episode at a family event of left-sided chest pain lasting for approximately 15 minutes which eventually resolve without taking nitroglycerin.  States the pain originated on her left side but radiated to mid chest.  She had no radiation to her neck, arms, back, or jaw.  She had no associated nausea, vomiting, or diaphoresis.  She continues to smoke.  She has never had a pulmonary consultation or PFTs to examine the extent of likely COPD.  She denies any other issues with CVA or TIA-like symptoms, orthostatic symptoms, palpitations or arrhythmias, blood in the stool, PND or orthopnea, lower extremity edema or claudication-like symptoms.   Past Medical History:  Diagnosis Date  . Bruising    Aspirin and Effient, February 2013  . CAD (coronary artery disease)    BMS to proximal LAD 06/2011  . Cocaine abuse (HCC)   . Drug resistance    P2Y12 was 311 (>208) January 2013, therefore Effient  (Prasugrel) used.  . Essential hypertension   . Marijuana abuse   . NSTEMI (non-ST elevated myocardial infarction) [facility]) January 2013    Past Surgical History:  Procedure Laterality Date  . CESAREAN SECTION     x 2  . LEFT HEART CATHETERIZATION WITH CORONARY ANGIOGRAM N/A 07/16/2011   Procedure: LEFT HEART CATHETERIZATION WITH CORONARY ANGIOGRAM;  Surgeon: Kathleene Hazel, MD;  Location: [facility] CATH LAB;  Service: Cardiovascular;  Laterality: N/A;  . PERCUTANEOUS CORONARY STENT INTERVENTION (PCI-S) N/A 07/16/2011   Procedure: PERCUTANEOUS CORONARY STENT INTERVENTION (PCI-S);  Surgeon: Kathleene Hazel, MD;  Location: [facility] CATH LAB;  Service: Cardiovascular;  Laterality: N/A;    Current Outpatient Medications  Medication Sig Dispense Refill  . amLODipine (NORVASC) 10 MG tablet TAKE ONE TABLET EACH DAY 90 tablet 2  . aspirin EC 81 MG tablet Take 81 mg by mouth daily.    Marland Kitchen atorvastatin (LIPITOR) 20 MG tablet TAKE ONE TABLET EACH DAY 90 tablet 2  . losartan (COZAAR) 100 MG tablet TAKE 1 TABLET BY MOUTH EVERY DAY 90 tablet 2  . nitroGLYCERIN (NITROSTAT) 0.4 MG SL tablet Place 1 tablet (0.4 mg total) under the tongue every 5 (five) minutes x 3 doses as needed for chest pain (If no relief after 3rd dose, proceed to the ED for an evaluation or call 911). 25 tablet 3   No current facility-administered medications for this visit.   Allergies:  Patient has no known allergies.   Social History: The patient  reports that she has been smoking cigarettes. She started smoking about 50 years ago. She has a 30.00 pack-year smoking history. She has never used smokeless tobacco. She reports current alcohol use. She reports current drug use. Drug: Marijuana.   Family History: The patient's family history includes Colon cancer in her brother; Diabetes in her mother; Hypertension in her father and mother.   ROS:  Please see the history of present illness. Otherwise, complete review of systems is  positive for none.  All other systems are reviewed and negative.   Physical Exam: VS:  BP 120/78   Pulse 64   Ht 5\' 4"  (1.626 m)   Wt 129 lb 6.4 oz (58.7 kg)   SpO2 98%   BMI 22.21 kg/m , BMI Body mass index is 22.21 kg/m.  Wt Readings from Last 3 Encounters:  11/10/19 129 lb 6.4 oz (58.7 kg)  10/02/17 129 lb (58.5 kg)  09/25/16 133 lb 6.4 oz (60.5 kg)    General: Patient appears comfortable at rest. Neck: Supple, no elevated JVP or carotid bruits, left lobe thyromegaly. Lungs: Clear to auscultation, nonlabored breathing at rest. Cardiac: Regular rate and rhythm, no S3 or significant systolic murmur, no pericardial rub. Extremities: No pitting edema, distal pulses 2+. Skin: Warm and dry. Musculoskeletal: No kyphosis. Neuropsychiatric: Alert and oriented x3, affect grossly appropriate.  ECG: EKG 11/10/2019 normal sinus rhythm rate of 61.  Recent Labwork: No results found for requested labs within last 8760 hours.     Component Value Date/Time   CHOL 189 07/15/2011 0525   TRIG 109 07/15/2011 0525   HDL 67 07/15/2011 0525   CHOLHDL 2.8 07/15/2011 0525   VLDL 22 07/15/2011 0525   LDLCALC 100 (H) 07/15/2011 0525    Other Studies Reviewed Today:  Echocardiogram 07/16/2011 Study Conclusions   Left ventricle: Technically difficult study. The cavity size  was normal. Wall thickness was increased in a pattern of  moderate LVH. The estimated ejection fraction was 60%. Wall  motion was normal; there were no regional wall motion  abnormalities. Transthoracic echocardiography.   Assessment and Plan:  1. CAD in native artery   2. Tobacco abuse   3. Mixed hyperlipidemia   4. Essential hypertension   5. Neck pain   6. Chest pain, unspecified type   7. Thyroid enlargement    1. CAD in native artery BMS to proximal LAD in 2013 patient states she has been having left-sided chest pain which sometimes radiates to the center of her chest.  States it is not necessarily associated  with exertion and can happen with or without exertion.  She had an episode Saturday at a family event with chest pain which lasted approximately 15 minutes and resolved.  She admits to exertional fatigue and dyspnea and can only walk short distances without giving out of breath and energy.  She denied any associated radiation, nausea, vomiting, or diaphoresis.  Please get a Lexiscan nuclear stress and echocardiogram.  Draw baseline high-sensitivity troponin.  Continue nitroglycerin as needed for chest pain and aspirin 81 mg.  2. Tobacco abuse At last visit she had planned on stopping smoking.  Patient continues to smoke daily.  She did not stop as discussed at last visit.  Highly advised her to stop smoking.  May need referral to pulmonology if stress and echo are negative..  3. Mixed hyperlipidemia She was to have a follow-up FLP and LFTs.  It appears that the lab work was not drawn.  Please reorder FLP and LFTs.  4. Essential hypertension Last visit she was continuing Norvasc and Cozaar.  Blood pressures well controlled on current therapy.  Continue amlodipine 10 mg, and losartan 100 mg daily.  5.  Thyroid enlargement Patient has an enlarged area on the left side of her trachea which appears to be left lobe of thyroid.  Also complaining of exertional fatigue.  Please get a TSH    Medication Adjustments/Labs and Tests Ordered: Current medicines are reviewed at length with the patient today.  Concerns regarding medicines are outlined above.   Disposition: Follow-up with Dr. Diona Browner or APP 4 to 6 weeks  Signed, Rennis Harding, NP 11/10/2019 11:02 AM    [facility] at [facility] [address] [place], [place], [place] [address] Phone: [phone]; Fax: [phone]

## 2019-11-10 ENCOUNTER — Ambulatory Visit (INDEPENDENT_AMBULATORY_CARE_PROVIDER_SITE_OTHER): Payer: Medicare Other | Admitting: Family Medicine

## 2019-11-10 ENCOUNTER — Telehealth: Payer: Self-pay | Admitting: Family Medicine

## 2019-11-10 ENCOUNTER — Other Ambulatory Visit: Payer: Self-pay

## 2019-11-10 ENCOUNTER — Encounter: Payer: Self-pay | Admitting: Family Medicine

## 2019-11-10 ENCOUNTER — Encounter: Payer: Self-pay | Admitting: *Deleted

## 2019-11-10 VITALS — BP 120/78 | HR 64 | Ht 64.0 in | Wt 129.4 lb

## 2019-11-10 DIAGNOSIS — M542 Cervicalgia: Secondary | ICD-10-CM | POA: Diagnosis not present

## 2019-11-10 DIAGNOSIS — I10 Essential (primary) hypertension: Secondary | ICD-10-CM

## 2019-11-10 DIAGNOSIS — E049 Nontoxic goiter, unspecified: Secondary | ICD-10-CM | POA: Diagnosis not present

## 2019-11-10 DIAGNOSIS — E782 Mixed hyperlipidemia: Secondary | ICD-10-CM | POA: Diagnosis not present

## 2019-11-10 DIAGNOSIS — Z720 Tobacco use: Secondary | ICD-10-CM

## 2019-11-10 DIAGNOSIS — I2510 Atherosclerotic heart disease of native coronary artery without angina pectoris: Secondary | ICD-10-CM | POA: Diagnosis not present

## 2019-11-10 DIAGNOSIS — Z79899 Other long term (current) drug therapy: Secondary | ICD-10-CM | POA: Diagnosis not present

## 2019-11-10 DIAGNOSIS — R079 Chest pain, unspecified: Secondary | ICD-10-CM

## 2019-11-10 MED ORDER — LOSARTAN POTASSIUM 100 MG PO TABS: ORAL_TABLET | ORAL | 2 refills | Status: DC

## 2019-11-10 MED ORDER — AMLODIPINE BESYLATE 10 MG PO TABS: ORAL_TABLET | ORAL | 2 refills | Status: DC

## 2019-11-10 MED ORDER — ATORVASTATIN CALCIUM 20 MG PO TABS: ORAL_TABLET | ORAL | 2 refills | Status: DC

## 2019-11-10 MED ORDER — NITROGLYCERIN 0.4 MG SL SUBL: 0.4000 mg | SUBLINGUAL_TABLET | SUBLINGUAL | 3 refills | Status: DC | PRN

## 2019-11-10 NOTE — Telephone Encounter (Signed)
Pre-cert Verification for the following procedure    LEXISCAN & ECHO   DATE:  11/27/2019  LOCATION: [facility]

## 2019-11-10 NOTE — Patient Instructions (Addendum)
Medication Instructions:    Your physician recommends that you continue on your current medications as directed. Please refer to the Current Medication list given to you today.  Labwork:  Your physician recommends that you have FASTING lipid/liver profile as well as TSH & Troponin done today at [facility].   Testing/Procedures: Your physician has requested that you have a lexiscan myoview. For further information please visit [email or web address] Please follow instruction sheet, as given. Your physician has requested that you have an echocardiogram. Echocardiography is a painless test that uses sound waves to create images of your heart. It provides your doctor with information about the size and shape of your heart and how well your heart's chambers and valves are working. This procedure takes approximately one hour. There are no restrictions for this procedure.  Follow-Up:  Your physician recommends that you schedule a follow-up appointment in: 4-6 weeks (office).  Any Other Special Instructions Will Be Listed Below (If Applicable).  If you need a refill on your cardiac medications before your next appointment, please call your pharmacy.

## 2019-11-17 ENCOUNTER — Telehealth: Payer: Self-pay | Admitting: *Deleted

## 2019-11-17 NOTE — Telephone Encounter (Signed)
-----   Message from Netta Neat., NP sent at 11/17/2019  4:16 PM EDT ----- Regarding: FW: all labs are available in care everywhere  Please call patient and tell her her HDL cholesterol which is the good cholesterol was 86 which is good.  Her total cholesterol was 221 which is a little higher than it should be which is around 200 or less.  Her LDL was 118.  Triglycerides were okay.  I believe we just started the statin medication so we would need to get a repeat fasting lipid profile prior to next follow-up.    Netta Neat., NP  Eustace Moore, RN  TSH and Trop I were both negative. You can call her and tell her.   Thank you

## 2019-11-27 ENCOUNTER — Other Ambulatory Visit: Payer: Self-pay

## 2019-11-27 ENCOUNTER — Encounter (HOSPITAL_COMMUNITY): Payer: Self-pay

## 2019-11-27 ENCOUNTER — Encounter (HOSPITAL_BASED_OUTPATIENT_CLINIC_OR_DEPARTMENT_OTHER)
Admission: RE | Admit: 2019-11-27 | Discharge: 2019-11-27 | Disposition: A | Discharge status: Home / Self Care | Payer: Medicare Other | Source: Ambulatory Visit | Attending: Family Medicine | Admitting: Family Medicine

## 2019-11-27 ENCOUNTER — Ambulatory Visit (HOSPITAL_COMMUNITY)
Admission: RE | Admit: 2019-11-27 | Discharge: 2019-11-27 | Disposition: A | Discharge status: Home / Self Care | Payer: Medicare Other | Source: Ambulatory Visit | Attending: Family Medicine | Admitting: Family Medicine

## 2019-11-27 ENCOUNTER — Encounter (HOSPITAL_COMMUNITY)
Admission: RE | Admit: 2019-11-27 | Discharge: 2019-11-27 | Disposition: A | Discharge status: Home / Self Care | Payer: Medicare Other | Source: Ambulatory Visit | Attending: Family Medicine | Admitting: Family Medicine

## 2019-11-27 DIAGNOSIS — R079 Chest pain, unspecified: Secondary | ICD-10-CM

## 2019-11-27 DIAGNOSIS — I10 Essential (primary) hypertension: Secondary | ICD-10-CM | POA: Diagnosis not present

## 2019-11-27 DIAGNOSIS — I2510 Atherosclerotic heart disease of native coronary artery without angina pectoris: Secondary | ICD-10-CM | POA: Insufficient documentation

## 2019-11-27 LAB — NM MYOCAR MULTI W/SPECT W/WALL MOTION / EF
LV dias vol: 58 mL (ref 46–106)
LV sys vol: 23 mL
Peak HR: 100 {beats}/min
RATE: 0.27
Rest HR: 59 {beats}/min
SDS: 0
SRS: 0
SSS: 0
TID: 0.94

## 2019-11-27 MED ORDER — SODIUM CHLORIDE FLUSH 0.9 % IV SOLN
INTRAVENOUS | Status: AC
  Administered 2019-11-27: 10 mL via INTRAVENOUS
  Filled 2019-11-27: qty 10

## 2019-11-27 MED ORDER — REGADENOSON 0.4 MG/5ML IV SOLN
INTRAVENOUS | Status: AC
  Administered 2019-11-27: 0.4 mg via INTRAVENOUS
  Filled 2019-11-27: qty 5

## 2019-11-27 MED ORDER — TECHNETIUM TC 99M TETROFOSMIN IV KIT
30.0000 | PACK | Freq: Once | INTRAVENOUS | Status: AC | PRN
  Administered 2019-11-27: 32 via INTRAVENOUS

## 2019-11-27 MED ORDER — TECHNETIUM TC 99M TETROFOSMIN IV KIT
10.0000 | PACK | Freq: Once | INTRAVENOUS | Status: AC | PRN
  Administered 2019-11-27: 9.5 via INTRAVENOUS

## 2019-11-27 NOTE — Progress Notes (Signed)
  Echocardiogram 2D Echocardiogram has been performed.  Melissa Mccall 11/27/2019, 11:45 AM

## 2019-11-30 NOTE — Telephone Encounter (Signed)
-----   Message from Netta Neat., NP sent at 11/29/2019  4:39 PM EDT ----- Please call the patient and tell her the stress test did not show any evidence of ischemia (lack of blood flow through the coronary arteries that would cause her chest pain or SOB). Tell her the pain is more than likely related to either her lungs or gastrointestinal system. I think we referred her to pulmonary due to her long term history of smoking. Please encourage her to see the lung doctor and please try to stop smoking. Thanks

## 2019-11-30 NOTE — Telephone Encounter (Signed)
-----   Message from Netta Neat., NP sent at 11/29/2019  4:34 PM EDT ----- Please call the patient and tell her the echocardiogram showed she has normal pumping function of her heart muscle. She had no leaky or narrowed valves. Everything looked normal. Nothing from a structural or functional standpoint that would cause her to have chest pain or shortness of breath

## 2019-12-03 NOTE — Telephone Encounter (Signed)
Patient informed. Copy sent to PCP °

## 2019-12-03 NOTE — Telephone Encounter (Signed)
OK Thanks. I think we discussed possibly sending her to pulmonary if stress ad echo were negative,

## 2019-12-17 NOTE — Progress Notes (Signed)
Cardiology Office Note  Date: 12/18/2019   ID: Melissa Mccall, DOB 02/24/1951, MRN [id number]  PCP:  Remigio Eisenmenger, MD  Cardiologist:  Nona Dell, MD Electrophysiologist:  None   Chief Complaint: Follow-up CAD, HLD, HTN, tobacco abuse  History of Present Illness: Melissa Mccall is a 69 y.o. female with a history of  CAD (BMS to LAD 2013), HLD, HTN, tobacco abuse.  Last encounter with Dr. Diona Browner on 04/15/2019 via telemedicine.  At that time she did not report any obvious anginal symptoms.  She had been taking her medications regularly including her statin therapy which she had not been consistent with in the past.  She was due for follow-up lab work.  She continued to smoke but was trying to quit again.  She continued on her antihypertensive medicine including Norvasc and Cozaar.  Follow-up FLP and LFTs were ordered.  She did not follow-up with having the labs drawn.  At the last visit she presented with complaints of significant exertional fatigue and episodes of chest pain with and without exertion.  She had a recent episode at a family event of left-sided chest pain lasting for approximately 15 minutes which eventually resolved without taking nitroglycerin.  Stated  the pain originated on her left side but radiated to mid chest.  She had no radiation to her neck, arms, back, or jaw.  She had no associated nausea, vomiting, or diaphoresis.  She continued to smoke.  She has never had a pulmonary consultation or PFTs to examine the extent of likely COPD.  She denied any other issues with CVA or TIA-like symptoms, orthostatic symptoms, palpitations or arrhythmias, blood in the stool, PND or orthopnea, lower extremity edema or claudication-like symptoms.   Past Medical History:  Diagnosis Date  . Bruising    Aspirin and Effient, February 2013  . CAD (coronary artery disease)    BMS to proximal LAD 06/2011  . Cocaine abuse (HCC)   . Drug resistance    P2Y12 was 311 (>208)  January 2013, therefore Effient (Prasugrel) used.  . Essential hypertension   . Marijuana abuse   . NSTEMI (non-ST elevated myocardial infarction) [facility]) January 2013    Past Surgical History:  Procedure Laterality Date  . CESAREAN SECTION     x 2  . LEFT HEART CATHETERIZATION WITH CORONARY ANGIOGRAM N/A 07/16/2011   Procedure: LEFT HEART CATHETERIZATION WITH CORONARY ANGIOGRAM;  Surgeon: Kathleene Hazel, MD;  Location: [facility] CATH LAB;  Service: Cardiovascular;  Laterality: N/A;  . PERCUTANEOUS CORONARY STENT INTERVENTION (PCI-S) N/A 07/16/2011   Procedure: PERCUTANEOUS CORONARY STENT INTERVENTION (PCI-S);  Surgeon: Kathleene Hazel, MD;  Location: [facility] CATH LAB;  Service: Cardiovascular;  Laterality: N/A;    Current Outpatient Medications  Medication Sig Dispense Refill  . amLODipine (NORVASC) 10 MG tablet TAKE ONE TABLET EACH DAY 90 tablet 2  . aspirin EC 81 MG tablet Take 81 mg by mouth daily.    Marland Kitchen atorvastatin (LIPITOR) 20 MG tablet TAKE ONE TABLET EACH DAY 90 tablet 2  . losartan (COZAAR) 100 MG tablet TAKE 1 TABLET BY MOUTH EVERY DAY 90 tablet 2  . nitroGLYCERIN (NITROSTAT) 0.4 MG SL tablet Place 1 tablet (0.4 mg total) under the tongue every 5 (five) minutes x 3 doses as needed for chest pain (If no relief after 3rd dose, proceed to the ED for an evaluation or call 911). 25 tablet 3   No current facility-administered medications for this visit.   Allergies:  Patient has no known allergies.  Social History: The patient  reports that she has been smoking cigarettes. She started smoking about 50 years ago. She has a 30.00 pack-year smoking history. She has never used smokeless tobacco. She reports current alcohol use. She reports current drug use. Drug: Marijuana.   Family History: The patient's family history includes Colon cancer in her brother; Diabetes in her mother; Hypertension in her father and mother.   ROS:  Please see the history of present illness. Otherwise,  complete review of systems is positive for none.  All other systems are reviewed and negative.   Physical Exam: VS:  BP 118/72   Pulse 68   Ht 5\' 4"  (1.626 m)   Wt 129 lb (58.5 kg)   SpO2 98%   BMI 22.14 kg/m , BMI Body mass index is 22.14 kg/m.  Wt Readings from Last 3 Encounters:  12/18/19 129 lb (58.5 kg)  11/10/19 129 lb 6.4 oz (58.7 kg)  10/02/17 129 lb (58.5 kg)    General: Patient appears comfortable at rest. Neck: Supple, no elevated JVP or carotid bruits, left lobe thyromegaly. Lungs: Clear to auscultation, nonlabored breathing at rest. Cardiac: Regular rate and rhythm, no S3 or significant systolic murmur, no pericardial rub. Extremities: No pitting edema, distal pulses 2+. Skin: Warm and dry. Musculoskeletal: No kyphosis. Neuropsychiatric: Alert and oriented x3, affect grossly appropriate.  ECG: EKG 11/10/2019 normal sinus rhythm rate of 61.  Recent Labwork: No results found for requested labs within last 8760 hours.     Component Value Date/Time   CHOL 189 07/15/2011 0525   TRIG 109 07/15/2011 0525   HDL 67 07/15/2011 0525   CHOLHDL 2.8 07/15/2011 0525   VLDL 22 07/15/2011 0525   LDLCALC 100 (H) 07/15/2011 0525    Other Studies Reviewed Today:  NST 11/27/2019 Study Result  Narrative & Impression    There was no ST segment deviation noted during stress.  The study is normal.  This is a low risk study.  The left ventricular ejection fraction is normal (55-65%).   Normal resting and stress perfusion. No ischemia or infarction EF 60%     Echo 11/27/2019  1. Left ventricular ejection fraction, by estimation, is 60 to 65%. The left ventricle has normal function. The left ventricle has no regional wall motion abnormalities. There is mild left ventricular hypertrophy. Left ventricular diastolic parameters were normal. 2. Right ventricular systolic function is normal. The right ventricular size is normal. 3. The mitral valve is normal in structure.  No evidence of mitral valve regurgitation. No evidence of mitral stenosis. 4. The aortic valve is normal in structure. Aortic valve regurgitation is not visualized. No aortic stenosis is present. 5. The inferior vena cava is normal in size with greater than 50% respiratory variability, suggesting right atrial pressure of 3 mmHg.    Echocardiogram 07/16/2011 Study Conclusions   Left ventricle: Technically difficult study. The cavity size  was normal. Wall thickness was increased in a pattern of  moderate LVH. The estimated ejection fraction was 60%. Wall  motion was normal; there were no regional wall motion  abnormalities. Transthoracic echocardiography.   Assessment and Plan:   1. CAD in native artery BMS to proximal LAD in 2013 patient states she has been having left-sided chest pain which sometimes radiates to the center of her chest.  Stated it was not necessarily associated with exertion and can happen with or without exertion.  She had an episode prior to previous visit on a Saturday at a family event with  chest pain which lasted approximately 15 minutes and resolved.  She admits to exertional fatigue and dyspnea and can only walk short distances without giving out of breath and energy.  She denied any associated radiation, nausea, vomiting, or diaphoresis.  Nuclear stress test was negative. Trop < 0.017  Continue nitroglycerin as needed for chest pain and aspirin 81 mg.  2. Tobacco abuse At last visit she had planned on stopping smoking.  Patient continues to smoke daily.  She did not stop as discussed at last visit.  Highly advised her to stop smoking. Continues with  SOB, long history of smoking.  Refer to Pulmonology for evaluation for COPD   3. Mixed hyperlipidemia Total cholesterol 221, triglycerides 85, HDL 86, LDL 118. Goal is LDL < 70. Increase atorvastatin to 40 mg po daily get FLP and LFT and 8-12 weeks  after increasing dosage.  4. Essential hypertension Last visit she  was continuing Norvasc and Cozaar.  Blood pressures well controlled on current therapy.  Continue amlodipine 10 mg, and losartan 100 mg daily.  5.  Thyroid enlargement Patient has an enlarged area on the left side of her trachea which appears to be left lobe of thyroid.  Also complaining of exertional fatigue.  TSH 1.078    Medication Adjustments/Labs and Tests Ordered: Current medicines are reviewed at length with the patient today.  Concerns regarding medicines are outlined above.   Disposition: Follow-up with Dr. Diona Browner or APP 6 months  Signed, Rennis Harding, NP 12/18/2019 10:20 AM    [facility] at [facility] [address] [place], [place], [place] [address] Phone: [phone]; Fax: [phone]

## 2019-12-18 ENCOUNTER — Encounter: Payer: Self-pay | Admitting: Family Medicine

## 2019-12-18 ENCOUNTER — Ambulatory Visit (INDEPENDENT_AMBULATORY_CARE_PROVIDER_SITE_OTHER): Payer: Medicare Other | Admitting: Family Medicine

## 2019-12-18 ENCOUNTER — Other Ambulatory Visit: Payer: Self-pay

## 2019-12-18 VITALS — BP 118/72 | HR 68 | Ht 64.0 in | Wt 129.0 lb

## 2019-12-18 DIAGNOSIS — R0602 Shortness of breath: Secondary | ICD-10-CM | POA: Diagnosis not present

## 2019-12-18 DIAGNOSIS — E782 Mixed hyperlipidemia: Secondary | ICD-10-CM | POA: Diagnosis not present

## 2019-12-18 MED ORDER — ATORVASTATIN CALCIUM 40 MG PO TABS
40.0000 mg | ORAL_TABLET | Freq: Every day | ORAL | 1 refills | Status: DC
Start: 2019-12-18 — End: 2020-06-20

## 2019-12-18 NOTE — Patient Instructions (Signed)
Your physician wants you to follow-up in: 6 MONTHS WITH DR MCDOWELL You will receive a reminder letter in the mail two months in advance. If you don't receive a letter, please call our office to schedule the follow-up appointment.  Your physician has recommended you make the following change in your medication:   INCREASE ATORVASTATIN 40 MG DAILY   You have been referred to PULMONARY   Your physician recommends that you return for lab work in: 8-12 WEEKS AFTER INCREASE OF ATORVASTATIN - LIPIDS  Thank you for choosing [facility]!!

## 2019-12-21 DIAGNOSIS — Z720 Tobacco use: Secondary | ICD-10-CM | POA: Diagnosis not present

## 2019-12-21 DIAGNOSIS — Z1239 Encounter for other screening for malignant neoplasm of breast: Secondary | ICD-10-CM | POA: Diagnosis not present

## 2019-12-21 DIAGNOSIS — N898 Other specified noninflammatory disorders of vagina: Secondary | ICD-10-CM | POA: Diagnosis not present

## 2019-12-21 DIAGNOSIS — I10 Essential (primary) hypertension: Secondary | ICD-10-CM | POA: Diagnosis not present

## 2019-12-21 DIAGNOSIS — Z5320 Procedure and treatment not carried out because of patient's decision for unspecified reasons: Secondary | ICD-10-CM | POA: Diagnosis not present

## 2019-12-21 DIAGNOSIS — Z0000 Encounter for general adult medical examination without abnormal findings: Secondary | ICD-10-CM | POA: Diagnosis not present

## 2019-12-21 DIAGNOSIS — I2510 Atherosclerotic heart disease of native coronary artery without angina pectoris: Secondary | ICD-10-CM | POA: Diagnosis not present

## 2019-12-21 DIAGNOSIS — E785 Hyperlipidemia, unspecified: Secondary | ICD-10-CM | POA: Diagnosis not present

## 2020-03-07 ENCOUNTER — Ambulatory Visit (INDEPENDENT_AMBULATORY_CARE_PROVIDER_SITE_OTHER): Payer: Medicare Other | Admitting: Internal Medicine

## 2020-03-07 ENCOUNTER — Other Ambulatory Visit: Payer: Self-pay

## 2020-03-07 ENCOUNTER — Encounter: Payer: Self-pay | Admitting: Internal Medicine

## 2020-03-07 ENCOUNTER — Ambulatory Visit (HOSPITAL_COMMUNITY)
Admission: RE | Admit: 2020-03-07 | Discharge: 2020-03-07 | Disposition: A | Discharge status: Home / Self Care | Payer: Medicare Other | Source: Ambulatory Visit | Attending: Internal Medicine | Admitting: Internal Medicine

## 2020-03-07 VITALS — BP 140/80 | HR 74 | Ht 64.0 in | Wt 128.0 lb

## 2020-03-07 DIAGNOSIS — I2510 Atherosclerotic heart disease of native coronary artery without angina pectoris: Secondary | ICD-10-CM

## 2020-03-07 DIAGNOSIS — R0600 Dyspnea, unspecified: Secondary | ICD-10-CM | POA: Diagnosis not present

## 2020-03-07 DIAGNOSIS — R0609 Other forms of dyspnea: Secondary | ICD-10-CM | POA: Insufficient documentation

## 2020-03-07 DIAGNOSIS — F17210 Nicotine dependence, cigarettes, uncomplicated: Secondary | ICD-10-CM

## 2020-03-07 DIAGNOSIS — J449 Chronic obstructive pulmonary disease, unspecified: Secondary | ICD-10-CM | POA: Diagnosis not present

## 2020-03-07 DIAGNOSIS — J9859 Other diseases of mediastinum, not elsewhere classified: Secondary | ICD-10-CM | POA: Diagnosis not present

## 2020-03-07 DIAGNOSIS — R05 Cough: Secondary | ICD-10-CM | POA: Diagnosis not present

## 2020-03-07 MED ORDER — FAMOTIDINE 20 MG PO TABS: ORAL_TABLET | ORAL | 11 refills | Status: DC

## 2020-03-07 MED ORDER — PANTOPRAZOLE SODIUM 40 MG PO TBEC: 40.0000 mg | DELAYED_RELEASE_TABLET | Freq: Every day | ORAL | 2 refills | Status: DC

## 2020-03-07 NOTE — Patient Instructions (Addendum)
The key is to stop smoking completely before smoking completely stops you!  Pantoprazole (protonix) 40 mg   Take  30-60 min before first meal of the day and Pepcid (famotidine)  20 mg one @  After  Supper  return to office - this is the best way to tell whether stomach acid is contributing to your problem.     GERD (REFLUX)  is an extremely common cause of respiratory symptoms just like yours , many times with no obvious heartburn at all.    It can be treated with medication, but also with lifestyle changes including elevation of the head of your bed (ideally with 6 -8inch blocks under the headboard of your bed),  Smoking cessation, avoidance of late meals, excessive alcohol, and avoid fatty foods, chocolate, peppermint, colas, red wine, and acidic juices such as orange juice.  NO MINT OR MENTHOL PRODUCTS SO NO COUGH DROPS  USE SUGARLESS CANDY INSTEAD (Jolley ranchers or Stover's or Life Savers) or even ice chips will also do - the key is to swallow to prevent all throat clearing. NO OIL BASED VITAMINS - use powdered substitutes.  Avoid fish oil when coughing.    Please remember to go to the  x-ray department  @  [facility] for your tests - we will call you with the results when they are available      Please schedule a follow up office visit in 6 weeks, call sooner if needed -  pfts on return  Late add  Trachea deviated to R by med mass c/w goiter > ct with contrast needed

## 2020-03-07 NOTE — Assessment & Plan Note (Signed)
Counseled re importance of smoking cessation but did not meet time criteria for separate billing   Low-dose CT lung cancer screening is recommended for patients who are 57-68 years of age with a 30+ pack-year history of smoking, and who are currently smoking or quit <=15 years ago.   >>>> eligible for new 9 years until age 55 per present guidelines > defer to PCP as part of health maint  Advised stopping smoking statistically saves more lives than LCS CT's           Each maintenance medication was reviewed in detail including emphasizing most importantly the difference between maintenance and prns and under what circumstances the prns are to be triggered using an action plan format where appropriate.  Total time for H and P, chart review, counseling,  directly observing portions of ambulatory 02 saturation study/  and generating customized AVS unique to this office visit / charting  > 60 min

## 2020-03-07 NOTE — Progress Notes (Addendum)
Melissa Mccall, female    DOB: 06-30-1950, 69 y.o.   MRN: [id number]   Brief patient profile:  63 yobf active smoker with h/o IHD s/p stent around 2021 with new onset doe 2018/2019 progressive to point of doe MMRC1 = can walk nl pace, flat grade, can't hurry or go uphills or steps s sob  referred to pulmonary clinic in [place]  03/07/2020 by Dr  McDowell's NP      History of Present Illness  03/07/2020  Pulmonary/ 1st office eval/ Rashawna Scoles / [place] [facility]  Chief Complaint  Patient presents with   Consult    Shortness of breath with exertion, dry/productive cough with white sputum. Patient has had it for awhile but has got worse. Stress test was good  Dyspnea:  MMRC1 = can walk nl pace, flat grade, can't hurry or go uphills or steps s sob   Cough: p supper and hs mostly dry  Sleep: difficult maybe be partly due to cough SABA use: none  CP x one year  sporadic L ant chest lasts as a long as a minute maybe once or twice a week  Not brought on by deep breath cough/ ex / better with pressure over pect major distribution but not changed with shoulder movement/ lifting with L arm  No obvious day to day or daytime variability or assoc excess/ purulent sputum or mucus plugs or hemoptysis or    chest tightness, subjective wheeze or overt sinus or hb symptoms.    Also denies any obvious fluctuation of symptoms with weather or environmental changes or other aggravating or alleviating factors except as outlined above   No unusual exposure hx or h/o childhood pna/ asthma or knowledge of premature birth.  Current Allergies, Complete Past Medical History, Past Surgical History, Family History, and Social History were reviewed in [employer] record.  ROS  The following are not active complaints unless bolded Hoarseness, sore throat, dysphagia, dental problems, itching, sneezing,  nasal congestion or discharge of excess mucus or purulent secretions, ear ache,   fever, chills,  sweats, unintended wt loss or wt gain, classically pleuritic or exertional cp,  orthopnea pnd or arm/hand swelling  or leg swelling, presyncope, palpitations, abdominal pain, anorexia, nausea, vomiting, diarrhea  or change in bowel habits or change in bladder habits, change in stools or change in urine, dysuria, hematuria,  rash, arthralgias, visual complaints, headache, numbness, weakness or ataxia or problems with walking or coordination,  change in mood or  memory.            Past Medical History:  Diagnosis Date   Bruising    Aspirin and Effient, February 2013   CAD (coronary artery disease)    BMS to proximal LAD 06/2011   Cocaine abuse (HCC)    Drug resistance    P2Y12 was 311 (>208) January 2013, therefore Effient (Prasugrel) used.   Essential hypertension    Marijuana abuse    NSTEMI (non-ST elevated myocardial infarction) [facility]) January 2013    Outpatient Medications Prior to Visit  Medication Sig Dispense Refill   amLODipine (NORVASC) 10 MG tablet TAKE ONE TABLET EACH DAY 90 tablet 2   aspirin EC 81 MG tablet Take 81 mg by mouth daily.     atorvastatin (LIPITOR) 40 MG tablet Take 1 tablet (40 mg total) by mouth daily. 90 tablet 1   losartan (COZAAR) 100 MG tablet TAKE 1 TABLET BY MOUTH EVERY DAY 90 tablet 2   nitroGLYCERIN (NITROSTAT) 0.4 MG SL tablet  Place 1 tablet (0.4 mg total) under the tongue every 5 (five) minutes x 3 doses as needed for chest pain (If no relief after 3rd dose, proceed to the ED for an evaluation or call 911). 25 tablet 3   No facility-administered medications prior to visit.     Objective:     BP 140/80 (BP Location: Left Arm, Patient Position: Sitting, Cuff Size: Normal)    Pulse 74    Ht 5\' 4"  (1.626 m)    Wt 128 lb (58.1 kg)    SpO2 99%    BMI 21.97 kg/m   SpO2: 99 %  RA   HEENT : pt wearing mask not removed for exam due to covid - 19 concerns.   NECK :  without JVD/Nodes/TM/ nl carotid upstrokes bilaterally   LUNGS: no acc  muscle use,  Min barrel  contour chest wall with bilateral  slightly decreased bs s audible wheeze and  without cough on insp or exp maneuvers and min  Hyperresonant  to  percussion bilaterally     CV:  RRR  no s3 or murmur or increase in P2, and no edema   ABD:  soft and nontender with pos end  insp Hoover's  in the supine position. No bruits or organomegaly appreciated, bowel sounds nl  MS:   Nl gait/  ext warm without deformities, calf tenderness, cyanosis or clubbing No obvious joint restrictions   SKIN: warm and dry without lesions    NEURO:  alert, approp, nl sensorium with  no motor or cerebellar deficits apparent.        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  No active lung disease.    NM myoview 11/27/19  Neg      Assessment   DOE (dyspnea on exertion) Active smoker -  Assoc with atypical cp and neg Myoview  11/27/19  -  03/07/2020   Walked RA  approx   300 ft  @ fast pace on high heels >  stopped due to  Sob with sats still   99% 03/07/2020 recs  :   gerd rx/ return for pfts/ stop smoking   Symptoms are markedly disproportionate to objective findings and not clear to what extent this is actually a pulmonary  problem but pt does appear to have difficult to sort out respiratory symptoms of unknown origin for which  DDX  = almost all start with A and  include Adherence, Ace Inhibitors, Acid Reflux, Active Sinus Disease, Alpha 1 Antitripsin deficiency, Anxiety masquerading as Airways dz,  ABPA,  Allergy(esp in young), Aspiration (esp in elderly), Adverse effects of meds,  Active smoking or Vaping, A bunch of PE's/clot burden (a few small clots can't cause this syndrome unless there is already severe underlying pulm or vascular dz with poor reserve),  Anemia or  thyroid disorder, plus two Bs  = Bronchiectasis and Beta blocker use..and one C= CHF    Adherence is always the initial "prime suspect" and is a multilayered concern that requires a "trust but verify" approach in every patient - starting with knowing how to use medications, especially inhalers, correctly, keeping up with refills and understanding the fundamental difference between maintenance and prns vs those medications only taken for a very short  course and then stopped and not refilled.  - return with all meds in hand using a trust but verify approach to confirm accurate Medication  Reconciliation The principal here is that until we are certain that the  patients are doing what we've asked, it makes no sense to ask them to do more.   Active smoking discussed separately   ? Acid (or non-acid) GERD > always difficult to exclude as up to 75% of pts in some series report no assoc GI/ Heartburn symptoms and she has assoc atypical cp > rec max (24h)  acid suppression and diet restrictions/ reviewed and instructions given in writing.   ? Anxiety/depression/ deconditioning > usually at the bottom of this list of usual suspects but   may interfere with adherence and also interpretation of response or lack thereof to symptom management which can be quite subjective.   ? Anemia/ thyroid disorder > says checked up at PCP > defer to PCP  ? 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  > CT chest rec   This looks very chronic on exam but may explain symptoms of cough and perceived doe x sev years of ? Etiology despite absence of pseudowheeze obvious thyroid mass on exam.        Cigarette smoker Counseled re importance of smoking cessation but did not meet time criteria for separate billing   Low-dose CT lung cancer screening is recommended for patients who are 27-63 years of age with a 30+  pack-year history of smoking, and who are currently smoking or quit <=15 years ago.   >>>> eligible for new 9 years until age 97 per present guidelines > defer to PCP as part of health maint  Advised stopping smoking statistically saves more lives than LCS CT's           Each maintenance medication was reviewed in detail including emphasizing most importantly the difference between maintenance and prns and under what circumstances the prns are to be triggered using an action plan format where appropriate.  Total time for H and P, chart review, counseling,  directly observing portions of ambulatory 02 saturation study/  and generating customized AVS unique to this office visit / charting  > 60 min            Sandrea Hughs, MD 03/07/2020

## 2020-03-07 NOTE — Assessment & Plan Note (Signed)
Active smoker -  Assoc with atypical cp and neg Myoview  11/27/19  -  03/07/2020   Walked RA  approx   300 ft  @ fast pace on high heels >  stopped due to  Sob with sats still   99% 03/07/2020 recs  :   gerd rx/ return for pfts/ stop smoking   Symptoms are markedly disproportionate to objective findings and not clear to what extent this is actually a pulmonary  problem but pt does appear to have difficult to sort out respiratory symptoms of unknown origin for which  DDX  = almost all start with A and  include Adherence, Ace Inhibitors, Acid Reflux, Active Sinus Disease, Alpha 1 Antitripsin deficiency, Anxiety masquerading as Airways dz,  ABPA,  Allergy(esp in young), Aspiration (esp in elderly), Adverse effects of meds,  Active smoking or Vaping, A bunch of PE's/clot burden (a few small clots can't cause this syndrome unless there is already severe underlying pulm or vascular dz with poor reserve),  Anemia or thyroid disorder, plus two Bs  = Bronchiectasis and Beta blocker use..and one C= CHF    Adherence is always the initial "prime suspect" and is a multilayered concern that requires a "trust but verify" approach in every patient - starting with knowing how to use medications, especially inhalers, correctly, keeping up with refills and understanding the fundamental difference between maintenance and prns vs those medications only taken for a very short course and then stopped and not refilled.  - return with all meds in hand using a trust but verify approach to confirm accurate Medication  Reconciliation The principal here is that until we are certain that the  patients are doing what we've asked, it makes no sense to ask them to do more.   Active smoking discussed separately   ? Acid (or non-acid) GERD > always difficult to exclude as up to 75% of pts in some series report no assoc GI/ Heartburn symptoms and she has assoc atypical cp > rec max (24h)  acid suppression and diet restrictions/ reviewed and  instructions given in writing.   ? Anxiety/depression/ deconditioning > usually at the bottom of this list of usual suspects but   may interfere with adherence and also interpretation of response or lack thereof to symptom management which can be quite subjective.   ? Anemia/ thyroid disorder > says checked up at PCP > defer to PCP  ? chf > not neg cards with with neg myoview 11/2019   >>> return with pfts 6 weeks

## 2020-03-08 DIAGNOSIS — J9859 Other diseases of mediastinum, not elsewhere classified: Secondary | ICD-10-CM | POA: Insufficient documentation

## 2020-03-08 NOTE — Assessment & Plan Note (Signed)
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  > CT chest rec   This looks very chronic on exam but may explain symptoms of cough and perceived doe x sev years of ? Etiology despite absence of pseudowheeze obvious thyroid mass on exam.

## 2020-03-10 NOTE — Progress Notes (Signed)
Pt notified of results and tests have been ordered

## 2020-04-08 ENCOUNTER — Other Ambulatory Visit: Payer: Self-pay

## 2020-04-08 ENCOUNTER — Other Ambulatory Visit (HOSPITAL_COMMUNITY)
Admission: RE | Admit: 2020-04-08 | Discharge: 2020-04-08 | Disposition: A | Discharge status: Home / Self Care | Payer: Medicare Other | Source: Ambulatory Visit | Attending: Internal Medicine | Admitting: Internal Medicine

## 2020-04-08 DIAGNOSIS — Z20822 Contact with and (suspected) exposure to covid-19: Secondary | ICD-10-CM | POA: Insufficient documentation

## 2020-04-08 DIAGNOSIS — Z01812 Encounter for preprocedural laboratory examination: Secondary | ICD-10-CM | POA: Diagnosis not present

## 2020-04-08 LAB — SARS CORONAVIRUS 2 (TAT 6-24 HRS): SARS Coronavirus 2: NEGATIVE

## 2020-04-12 ENCOUNTER — Other Ambulatory Visit: Payer: Self-pay

## 2020-04-12 ENCOUNTER — Ambulatory Visit (HOSPITAL_COMMUNITY)
Admission: RE | Admit: 2020-04-12 | Discharge: 2020-04-12 | Disposition: A | Discharge status: Home / Self Care | Payer: Medicare Other | Source: Ambulatory Visit | Attending: Internal Medicine | Admitting: Internal Medicine

## 2020-04-12 DIAGNOSIS — R0609 Other forms of dyspnea: Secondary | ICD-10-CM

## 2020-04-12 DIAGNOSIS — R0600 Dyspnea, unspecified: Secondary | ICD-10-CM | POA: Diagnosis not present

## 2020-04-12 LAB — PULMONARY FUNCTION TEST
DL/VA % pred: 61 %
DL/VA: 2.54 ml/min/mmHg/L
DLCO unc % pred: 51 %
DLCO unc: 10.08 ml/min/mmHg
FEF 25-75 Post: 0.85 L/sec
FEF 25-75 Pre: 0.53 L/sec
FEF2575-%Change-Post: 60 %
FEF2575-%Pred-Post: 47 %
FEF2575-%Pred-Pre: 29 %
FEV1-%Change-Post: 16 %
FEV1-%Pred-Post: 75 %
FEV1-%Pred-Pre: 64 %
FEV1-Post: 1.43 L
FEV1-Pre: 1.23 L
FEV1FVC-%Change-Post: 5 %
FEV1FVC-%Pred-Pre: 71 %
FEV6-%Change-Post: 12 %
FEV6-%Pred-Post: 99 %
FEV6-%Pred-Pre: 88 %
FEV6-Post: 2.33 L
FEV6-Pre: 2.08 L
FEV6FVC-%Change-Post: 1 %
FEV6FVC-%Pred-Post: 100 %
FEV6FVC-%Pred-Pre: 98 %
FVC-%Change-Post: 10 %
FVC-%Pred-Post: 99 %
FVC-%Pred-Pre: 90 %
FVC-Post: 2.41 L
FVC-Pre: 2.19 L
Post FEV1/FVC ratio: 59 %
Post FEV6/FVC ratio: 96 %
Pre FEV1/FVC ratio: 56 %
Pre FEV6/FVC Ratio: 95 %
RV % pred: 154 %
RV: 3.32 L
TLC % pred: 109 %
TLC: 5.55 L

## 2020-04-12 MED ORDER — ALBUTEROL SULFATE (2.5 MG/3ML) 0.083% IN NEBU
2.5000 mg | INHALATION_SOLUTION | Freq: Once | RESPIRATORY_TRACT | Status: AC
  Administered 2020-04-12: 2.5 mg via RESPIRATORY_TRACT

## 2020-04-16 ENCOUNTER — Encounter: Payer: Self-pay | Admitting: Internal Medicine

## 2020-04-16 DIAGNOSIS — J449 Chronic obstructive pulmonary disease, unspecified: Secondary | ICD-10-CM | POA: Insufficient documentation

## 2020-04-18 ENCOUNTER — Other Ambulatory Visit: Payer: Self-pay

## 2020-04-18 ENCOUNTER — Ambulatory Visit (INDEPENDENT_AMBULATORY_CARE_PROVIDER_SITE_OTHER): Payer: Medicare Other | Admitting: Internal Medicine

## 2020-04-18 ENCOUNTER — Encounter: Payer: Self-pay | Admitting: Internal Medicine

## 2020-04-18 DIAGNOSIS — I2510 Atherosclerotic heart disease of native coronary artery without angina pectoris: Secondary | ICD-10-CM | POA: Diagnosis not present

## 2020-04-18 DIAGNOSIS — F17210 Nicotine dependence, cigarettes, uncomplicated: Secondary | ICD-10-CM

## 2020-04-18 DIAGNOSIS — J449 Chronic obstructive pulmonary disease, unspecified: Secondary | ICD-10-CM

## 2020-04-18 DIAGNOSIS — J9859 Other diseases of mediastinum, not elsewhere classified: Secondary | ICD-10-CM

## 2020-04-18 MED ORDER — BUDESONIDE-FORMOTEROL FUMARATE 160-4.5 MCG/ACT IN AERO
INHALATION_SPRAY | RESPIRATORY_TRACT | 12 refills | Status: DC
Start: 2020-04-18 — End: 2021-05-31

## 2020-04-18 NOTE — Assessment & Plan Note (Signed)
Counseled re importance of smoking cessation but did not meet time criteria for separate billing    Advised: Low-dose CT lung cancer screening is recommended for patients who are 69-68 years of age with a 30+ pack-year history of smoking, and who are currently smoking or quit <=15 years ago.   >>> can call our office to schdule or do thru PCP  Pt informed of the seriousness of COVID 19 infection as a direct risk to lung health  and safey and to close contacts and should continue to wear a facemask in public and minimize exposure to public locations but especially avoid any area or activity where non-close contacts are not observing distancing or wearing an appropriate face mask.  I strongly recommended she take either of the vaccines available through local drugstores based on updated information on millions of Americans treated with the Moderna and [employer] products  which have proven both safe and  effective even against the new delta variant.

## 2020-04-18 NOTE — Progress Notes (Signed)
Melissa Mccall, female    DOB: [birth year], 69 y.o.   MRN: [id number]   Brief patient profile:  9 yobf active smoker with h/o IHD s/p stent around 2013 with new onset doe 2018/2019 progressive to point of doe MMRC1 = can walk nl pace, flat grade, can't hurry or go uphills or steps s sob  referred to pulmonary clinic in [place]  03/07/2020 by Dr  Melissa Mccall's NP      History of Present Illness  03/07/2020  Pulmonary/ 1st office eval/ Melissa Mccall / [facility]  Chief Complaint  Patient presents with  . Consult    Shortness of breath with exertion, dry/productive cough with white sputum. Patient has had it for awhile but has got worse. Stress test was good  Dyspnea:  MMRC1 = can walk nl pace, flat grade, can't hurry or go uphills or steps s sob   Cough: p supper and hs mostly dry  Sleep: difficult maybe be partly due to cough SABA use: none  CP x one year  sporadic L ant chest lasts as a long as a minute maybe once or twice a week  Not brought on by deep breath cough/ ex / better with pressure over pect major muscle distribution but not changed with shoulder movement/ lifting with L arm rec The key is to stop smoking completely before smoking completely stops you! Pantoprazole (protonix) 40 mg   Take  30-60 min before first meal of the day and Pepcid (famotidine)  20 mg one @  After  Supper GERD  diet  Please remember to go to the  x-ray department  @  [facility] for your tests - we will call you with the results when they are available    Please schedule a follow up office visit in 6 weeks, call sooner if needed -  pfts on return  Late add  Trachea deviated to R by med mass c/w goiter > ct with contrast needed   04/18/2020  f/u ov/[place] office/Melissa Mccall re: GOLD II copd / still smoking, not vaccinated for covid  Chief Complaint  Patient presents with  . Follow-up    shortness of breath with exertion  Dyspnea:  MMRC1 = can walk nl pace, flat grade, can't hurry or go uphills  or steps s sob   Cough: sporadic  Sleeping: able to  Lie on side / on a flat bed  SABA use: none  02: none    No obvious day to day or daytime variability or assoc excess/ purulent sputum or mucus plugs or hemoptysis or cp or chest tightness, subjective wheeze or overt sinus or hb symptoms.   Sleeping  without nocturnal  or early am exacerbation  of respiratory  c/o's or need for noct saba. Also denies any obvious fluctuation of symptoms with weather or environmental changes or other aggravating or alleviating factors except as outlined above   No unusual exposure hx or h/o childhood pna/ asthma or knowledge of premature birth.  Current Allergies, Complete Past Medical History, Past Surgical History, Family History, and Social History were reviewed in [employer] record.  ROS  The following are not active complaints unless bolded Hoarseness, sore throat, dysphagia, dental problems, itching, sneezing,  nasal congestion or discharge of excess mucus or purulent secretions, ear ache,   fever, chills, sweats, unintended wt loss or wt gain, classically pleuritic or exertional cp,  orthopnea pnd or arm/hand swelling  or leg swelling, presyncope, palpitations, abdominal pain, anorexia, nausea, vomiting, diarrhea  or change in bowel habits or change in bladder habits, change in stools or change in urine, dysuria, hematuria,  rash, arthralgias, visual complaints, headache, numbness, weakness or ataxia or problems with walking or coordination,  change in mood or  memory.        Current Meds  Medication Sig  . amLODipine (NORVASC) 10 MG tablet TAKE ONE TABLET EACH DAY  . aspirin EC 81 MG tablet Take 81 mg by mouth daily.  Marland Kitchen losartan (COZAAR) 100 MG tablet TAKE 1 TABLET BY MOUTH EVERY DAY  . nitroGLYCERIN (NITROSTAT) 0.4 MG SL tablet Place 1 tablet (0.4 mg total) under the tongue every 5 (five) minutes x 3 doses as needed for chest pain (If no relief after 3rd dose, proceed to the  ED for an evaluation or call 911).            Past Medical History:  Diagnosis Date  . Bruising    Aspirin and Effient, February 2013  . CAD (coronary artery disease)    BMS to proximal LAD 06/2011  . Cocaine abuse (HCC)   . Drug resistance    P2Y12 was 311 (>208) January 2013, therefore Effient (Prasugrel) used.  . Essential hypertension   . Marijuana abuse   . NSTEMI (non-ST elevated myocardial infarction) [facility]) January 2013        Objective:    amb bf nad    Wt Readings from Last 3 Encounters:  04/18/20 124 lb 9.6 oz (56.5 kg)  03/07/20 128 lb (58.1 kg)  12/18/19 129 lb (58.5 kg)     Vital signs reviewed - Note on arrival 04/18/2020  02 sats  100% on RA      HEENT : pt wearing mask not removed for exam due to covid - 19 concerns./ mild TM   NECK :  without JVD/Nodes/TM/ nl carotid upstrokes bilaterally   LUNGS: no acc muscle use,  Min barrel  contour chest wall with bilateral  slightly decreased bs s audible wheeze and  without cough on insp or exp maneuvers and min  Hyperresonant  to  percussion bilaterally     CV:  RRR  no s3 or murmur or increase in P2, and no edema   ABD:  soft and nontender with pos end  insp Hoover's  in the supine position. No bruits or organomegaly appreciated, bowel sounds nl  MS:   Nl gait/  ext warm without deformities, calf tenderness, cyanosis or clubbing No obvious joint restrictions   SKIN: warm and dry without lesions    NEURO:  alert, approp, nl sensorium with  no motor or cerebellar deficits apparent.                      Assessment

## 2020-04-18 NOTE — Assessment & Plan Note (Signed)
Active smoker -  Assoc with atypical cp and neg Myoview  11/27/19  -  03/07/2020   Walked RA  approx   300 ft  @ fast pace on high heels >  stopped due to  Sob with sats still   99% 03/07/2020 recs  :   gerd rx/ return for pfts/ stop smoking  -  PFT's  04/12/20   FEV1 1.43 (75 % ) ratio 0.59  p 16 % improvement from saba p 0 prior to study with DLCO  10.08 (51%) corrects to 2.54 (60%)  for alv volume and FV curve classic curvature s insp truncation - 04/18/2020 rec symbicort 160 2 pffs up to every 12 hours if symptomatic    More of an  AB pattern at present and very mild so can just use symb 160 2bid when symptomatic.  >>> regroup in 6 m

## 2020-04-18 NOTE — Patient Instructions (Addendum)
If breathing or coughing are getting worse then use symbicort 160 up to 2 puff every 12 hours   You only have mild copd, and once you quit smoking it should not get worse.   You are eligible for lung cancer screening we can schedule here at [facility] but let me know if you want [place]  to handle it so you can set up at televisit  with Kandice Robinsons NP 1st   The key is to stop smoking completely before smoking completely stops you!     I very strongly recommend you get the moderna or pfizer vaccine as soon as possible based on your risk of dying from the virus  and the proven safety and benefit of these vaccines against even the delta variant.  This can save your life as well as  those of your loved ones,  especially if they are also not vaccinated.        Please schedule a follow up visit in 6 months but call sooner if needed - bring your inhaler with you

## 2020-04-20 ENCOUNTER — Encounter: Payer: Self-pay | Admitting: Internal Medicine

## 2020-04-20 NOTE — Assessment & Plan Note (Signed)
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  > CT chest rec   Discussed at length with pt today .  She was already aware of the problem and says she has seen a thyroid specialist who did not rec any change in rx or further w/u.   From my perspective, these are very common and unless they cause tracheal obstruction (which is absent on ct by f/v loop reviewed today) there is not no indication for intervention.  However, I informed pt  I would like the name of the doctor who previously eval the problem to get the records or let her PCP work out f/u with a local endocrinologist if she doesn't have one now.   Discussed in detail all the  indications, usual  risks and alternatives  relative to the benefits with patient who agrees to proceed with w/u as outlined.             Each maintenance medication was reviewed in detail including emphasizing most importantly the difference between maintenance and prns and under what circumstances the prns are to be triggered using an action plan format where appropriate.  Total time for H and P, chart review, counseling, teaching device and generating customized AVS unique to this office visit / charting > 30 min

## 2020-06-17 ENCOUNTER — Other Ambulatory Visit: Payer: Self-pay | Admitting: Family Medicine

## 2020-10-02 ENCOUNTER — Other Ambulatory Visit: Payer: Self-pay | Admitting: Family Medicine

## 2020-10-17 ENCOUNTER — Other Ambulatory Visit: Payer: Self-pay | Admitting: Family Medicine

## 2020-10-17 ENCOUNTER — Telehealth: Payer: Self-pay | Admitting: Cardiology

## 2020-10-17 MED ORDER — AMLODIPINE BESYLATE 10 MG PO TABS: ORAL_TABLET | ORAL | 0 refills | Status: DC

## 2020-10-17 NOTE — Telephone Encounter (Signed)
Pt has been scheduled for May 27th at 3:30 pm with Verita Schneiders, NP. Medication refill for 30 days sent to the pharmacy.

## 2020-10-17 NOTE — Telephone Encounter (Signed)
New message     Patient states she doesn't understand why she has to setup an appointment to get her amlodipine filled when she only comes once a year.   She is out of her blood pressure medication and needs a refill called in, she would like a call from the nurse to explain why it was changed from a year to 6 months.

## 2020-10-24 ENCOUNTER — Other Ambulatory Visit: Payer: Self-pay | Admitting: Family Medicine

## 2020-10-25 ENCOUNTER — Other Ambulatory Visit: Payer: Self-pay | Admitting: Cardiology

## 2020-11-10 NOTE — Progress Notes (Signed)
Cardiology Office Note  Date: 11/10/2020   ID: Melissa Mccall, DOB 1950/12/17, MRN [id number]  PCP:  Remigio Eisenmenger, MD  Cardiologist:  Nona Dell, MD Electrophysiologist:  None   Chief Complaint: Follow-up CAD, HLD, HTN, tobacco abuse  History of Present Illness: Melissa Mccall is a 70 y.o. female with a history of  CAD (BMS to LAD 2013), HLD, HTN, tobacco abuse.  Last encounter with Dr. Diona Browner on 04/15/2019 via telemedicine.  At that time she did not report any obvious anginal symptoms.  She had been taking her medications regularly including her statin therapy which she had not been consistent with in the past.  She was due for follow-up lab work.  She continued to smoke but was trying to quit again.  She continued on her antihypertensive medicine including Norvasc and Cozaar.  Follow-up FLP and LFTs were ordered.  She did not follow-up with having the labs drawn.  At the last visit she presented with complaints of significant exertional fatigue and episodes of chest pain with and without exertion.  She had a recent episode at a family event of left-sided chest pain lasting for approximately 15 minutes which eventually resolved without taking nitroglycerin.  Stated  the pain originated on her left side but radiated to mid chest.  She had no radiation to her neck, arms, back, or jaw.  She had no associated nausea, vomiting, or diaphoresis.  She continued to smoke.  She never had a pulmonary consultation or PFTs to examine the extent of likely COPD.  She denied any other issues with CVA or TIA-like symptoms, orthostatic symptoms, palpitations or arrhythmias, blood in the stool, PND or orthopnea, lower extremity edema or claudication-like symptoms.  Saw Dr Sherene Sires 04/18/2020: complaining of SOB, dry productive cough for awhile but had recently gotten worse. He emphasized stopping smoking. Chest xray ordered. 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 Indicated CT contrast was  needed. He mentioned she was eligible for lung ca screening and to let him know if she wanted screening done. She was to use Symbicort 160 up to 2 puffs q 12 hours. He recommended Covid vaccine.  She is here today for 26-month follow-up.  Her daughter who is with her states she stopped taking her Symbicort stating it did not seem to work.  She stopped taking her Protonix and famotidine stating it did not feel as though these medications were working either.  She continues to have some coughing and some shortness of breath.  Her daughter states she recently had the flu although she had no formal testing to diagnose influenza or COVID.  She continues to smoke in spite of being urged on multiple occasions to stop.  She complains of left subcostal pain which comes and goes and is not associated with exertion.  She denies any radiation or associated nausea, vomiting, or diaphoresis.  Dr. Sherene Sires had mentioned a CT from lungs cancer screening.  She had apparently deferred at that point but wants to proceed.  He also mentions she had a submediastinal goiter which could displace the trachea and may need contrast CT to assess.  He also recommended she have the COVID-vaccine.  Daughter states she does not want to discuss having the vaccine.  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  Past Medical History:  Diagnosis Date  . Bruising    Aspirin and Effient, February 2013  . CAD (coronary artery disease)  BMS to proximal LAD 06/2011  . Cocaine abuse (HCC)   . Drug resistance    P2Y12 was 311 (>208) January 2013, therefore Effient (Prasugrel) used.  . Essential hypertension   . Marijuana abuse   . NSTEMI (non-ST elevated myocardial infarction) [facility]) January 2013    Past Surgical History:  Procedure Laterality Date  . CESAREAN SECTION     x 2  . LEFT HEART CATHETERIZATION WITH CORONARY ANGIOGRAM N/A 07/16/2011   Procedure: LEFT HEART CATHETERIZATION  WITH CORONARY ANGIOGRAM;  Surgeon: Kathleene Hazel, MD;  Location[facility] C[facility] LAB;  Service: Cardiovascular;  Laterality: N/A;  . PERCUTANEOUS CORONARY STENT INTERVENTION (PCI-S) N/A 07/16/2011   Procedure: PERCUTANEOUS CORONARY STENT INTERVENTION (PCI-S);  Surgeon: Kathleene Hazel, MD;  Location: [facility] CATH LAB;  Service: Cardiovascular;  Laterality: N/A;    Current Outpatient Medications  Medication Sig Dispense Refill  . amLODipine (NORVASC) 10 MG tablet TAKE 1 TABLET BY MOUTH EVERY DAY 30 tablet 0  . aspirin EC 81 MG tablet Take 81 mg by mouth daily.    Marland Kitchen atorvastatin (LIPITOR) 40 MG tablet TAKE 1 TABLET BY MOUTH EVERY DAY 90 tablet 1  . budesonide-formoterol (SYMBICORT) 160-4.5 MCG/ACT inhaler Take 2 puffs first thing in am and then another 2 puffs about 12 hours later. 1 each 12  . famotidine (PEPCID) 20 MG tablet One after supper (Patient not taking: Reported on 04/18/2020) 30 tablet 11  . losartan (COZAAR) 100 MG tablet TAKE 1 TABLET BY MOUTH EVERY DAY 90 tablet 0  . nitroGLYCERIN (NITROSTAT) 0.4 MG SL tablet Place 1 tablet (0.4 mg total) under the tongue every 5 (five) minutes x 3 doses as needed for chest pain (If no relief after 3rd dose, proceed to the ED for an evaluation or call 911). 25 tablet 3  . pantoprazole (PROTONIX) 40 MG tablet Take 1 tablet (40 mg total) by mouth daily. Take 30-60 min before first meal of the day (Patient not taking: Reported on 04/18/2020) 30 tablet 2   No current facility-administered medications for this visit.   Allergies:  Patient has no known allergies.   Social History: The patient  reports that she has been smoking cigarettes. She started smoking about 51 years ago. She has a 20.00 pack-year smoking history. She has never used smokeless tobacco. She reports current alcohol use. She reports current drug use. Drug: Marijuana.   Family History: The patient's family history includes Colon cancer in her brother; Diabetes in her mother;  Hypertension in her father and mother.   ROS:  Please see the history of present illness. Otherwise, complete review of systems is positive for none.  All other systems are reviewed and negative.   Physical Exam: VS:  There were no vitals taken for this visit., BMI There is no height or weight on file to calculate BMI.  Wt Readings from Last 3 Encounters:  04/18/20 124 lb 9.6 oz (56.5 kg)  03/07/20 128 lb (58.1 kg)  12/18/19 129 lb (58.5 kg)    General: Patient appears comfortable at rest. Neck: Supple, no elevated JVP or carotid bruits, left lobe thyromegaly. Lungs: Clear to auscultation, nonlabored breathing at rest. Cardiac: Regular rate and rhythm, no S3 or significant systolic murmur, no pericardial rub. Extremities: No pitting edema, distal pulses 2+. Skin: Warm and dry. Musculoskeletal: No kyphosis. Neuropsychiatric: Alert and oriented x3, affect grossly appropriate.  ECG: 11/11/2020 normal sinus rhythm rate of 77, biatrial enlargement  Recent Labwork: No results found for requested labs within last 8760  hours.     Component Value Date/Time   CHOL 189 07/15/2011 0525   TRIG 109 07/15/2011 0525   HDL 67 07/15/2011 0525   CHOLHDL 2.8 07/15/2011 0525   VLDL 22 07/15/2011 0525   LDLCALC 100 (H) 07/15/2011 0525    Other Studies Reviewed Today:  PFTS 04/12/2020 Pulmonary Function Diagnosis: Moderate Obstructive Airways Disease with reversible component Moderately severe Diffusion Defect    NST 11/27/2019 Study Result  Narrative & Impression    There was no ST segment deviation noted during stress.  The study is normal.  This is a low risk study.  The left ventricular ejection fraction is normal (55-65%).   Normal resting and stress perfusion. No ischemia or infarction EF 60%     Echo 11/27/2019  1. Left ventricular ejection fraction, by estimation, is 60 to 65%. The left ventricle has normal function. The left ventricle has no regional wall motion  abnormalities. There is mild left ventricular hypertrophy. Left ventricular diastolic parameters were normal. 2. Right ventricular systolic function is normal. The right ventricular size is normal. 3. The mitral valve is normal in structure. No evidence of mitral valve regurgitation. No evidence of mitral stenosis. 4. The aortic valve is normal in structure. Aortic valve regurgitation is not visualized. No aortic stenosis is present. 5. The inferior vena cava is normal in size with greater than 50% respiratory variability, suggesting right atrial pressure of 3 mmHg.    Echocardiogram 07/16/2011 Study Conclusions   Left ventricle: Technically difficult study. The cavity size  was normal. Wall thickness was increased in a pattern of  moderate LVH. The estimated ejection fraction was 60%. Wall  motion was normal; there were no regional wall motion  abnormalities. Transthoracic echocardiography.   Assessment and Plan:   1. CAD in native artery BMS to proximal LAD in 2013.  At last visit she stated she had been having left-sided chest pain which sometimes radiated to the center of her chest.  She continues today with left costal/subcostal pain which comes and goes at random not associated with exertion.  Nuclear stress test was negative.  Continue nitroglycerin as needed for chest pain and aspirin 81 mg.  2. Tobacco abuse At last visit she had planned on stopping smoking.  She continues to smoke daily.  She did not stop as discussed at last visit.  Highly advised her to stop smoking. Continues with  SOB, long history of smoking.  She saw Dr. Sherene Sires recently who recommended CT to screen for lung cancer.  Her PFTs as noted above mentioned Moderate obstructive airway disease with reversible component, moderately severe diffusion defect.  Patient is okay to proceed with chest CT for lung cancer screening.  Please get a screening CT for lung CA.  3. Mixed hyperlipidemia Total cholesterol 221,  triglycerides 85, HDL 86, LDL 118. Goal is LDL < 70. Increase atorvastatin to 40 mg po daily get FLP and LFT and 8-12 weeks  after increasing dosage.  4. Essential hypertension  Blood pressure today 108/70 continue amlodipine 10 mg, and losartan 100 mg daily.  5.  Thyroid enlargement Patient has an enlarged area on the left side of her trachea which appears to be left lobe of thyroid.  Also complaining of exertional fatigue.  TSH 1.078.  Dr. 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  Patient states she did not proceed with following through  for CT.  She is agreeing to CT of chest to screen for lung cancer.    Medication Adjustments/Labs and Tests Ordered: Current medicines are reviewed at length with the patient today.  Concerns regarding medicines are outlined above.   Disposition: Follow-up with Dr. Diona BrownerMcDowell or APP 6 months  Signed, Rennis HardingAndrew Quinn, NP 11/10/2020 8:54 PM    [facility] [address], [place], [place] [address] Phone: [phone]) [phone]; Fax: [phone]

## 2020-11-11 ENCOUNTER — Other Ambulatory Visit: Payer: Self-pay | Admitting: Cardiology

## 2020-11-11 ENCOUNTER — Other Ambulatory Visit: Payer: Self-pay | Admitting: Internal Medicine

## 2020-11-11 ENCOUNTER — Ambulatory Visit (INDEPENDENT_AMBULATORY_CARE_PROVIDER_SITE_OTHER): Payer: Medicare Other | Admitting: Family Medicine

## 2020-11-11 ENCOUNTER — Encounter: Payer: Self-pay | Admitting: Family Medicine

## 2020-11-11 VITALS — BP 108/70 | HR 77 | Ht 64.0 in | Wt 122.8 lb

## 2020-11-11 DIAGNOSIS — I2510 Atherosclerotic heart disease of native coronary artery without angina pectoris: Secondary | ICD-10-CM

## 2020-11-11 DIAGNOSIS — Z122 Encounter for screening for malignant neoplasm of respiratory organs: Secondary | ICD-10-CM | POA: Diagnosis not present

## 2020-11-11 DIAGNOSIS — I10 Essential (primary) hypertension: Secondary | ICD-10-CM | POA: Diagnosis not present

## 2020-11-11 DIAGNOSIS — Z720 Tobacco use: Secondary | ICD-10-CM | POA: Diagnosis not present

## 2020-11-11 DIAGNOSIS — R0600 Dyspnea, unspecified: Secondary | ICD-10-CM

## 2020-11-11 DIAGNOSIS — R0609 Other forms of dyspnea: Secondary | ICD-10-CM

## 2020-11-11 DIAGNOSIS — E782 Mixed hyperlipidemia: Secondary | ICD-10-CM | POA: Diagnosis not present

## 2020-11-11 MED ORDER — NITROGLYCERIN 0.4 MG SL SUBL: 0.4000 mg | SUBLINGUAL_TABLET | SUBLINGUAL | 3 refills | Status: DC | PRN

## 2020-11-11 MED ORDER — LOSARTAN POTASSIUM 100 MG PO TABS: 1.0000 | ORAL_TABLET | Freq: Every day | ORAL | 3 refills | Status: DC

## 2020-11-11 MED ORDER — ATORVASTATIN CALCIUM 40 MG PO TABS: 1.0000 | ORAL_TABLET | Freq: Every day | ORAL | 3 refills | Status: DC

## 2020-11-11 MED ORDER — AMLODIPINE BESYLATE 10 MG PO TABS: ORAL_TABLET | ORAL | 3 refills | Status: DC

## 2020-11-11 NOTE — Patient Instructions (Addendum)
Medication Instructions:  Continue all current medications.  Labwork: none  Testing/Procedures:  Chest CT   Office will contact with results via phone or letter.    Follow-Up: 6-8 weeks   Any Other Special Instructions Will Be Listed Below (If Applicable).  If you need a refill on your cardiac medications before your next appointment, please call your pharmacy.

## 2020-11-23 ENCOUNTER — Ambulatory Visit (HOSPITAL_COMMUNITY)
Admission: RE | Admit: 2020-11-23 | Discharge: 2020-11-23 | Disposition: A | Discharge status: Home / Self Care | Payer: Medicare Other | Source: Ambulatory Visit | Attending: Family Medicine | Admitting: Family Medicine

## 2020-11-23 ENCOUNTER — Other Ambulatory Visit: Payer: Self-pay

## 2020-11-23 DIAGNOSIS — Z87891 Personal history of nicotine dependence: Secondary | ICD-10-CM | POA: Diagnosis not present

## 2020-11-23 DIAGNOSIS — I700 Atherosclerosis of aorta: Secondary | ICD-10-CM | POA: Insufficient documentation

## 2020-11-23 DIAGNOSIS — I2510 Atherosclerotic heart disease of native coronary artery without angina pectoris: Secondary | ICD-10-CM | POA: Insufficient documentation

## 2020-11-23 DIAGNOSIS — J432 Centrilobular emphysema: Secondary | ICD-10-CM | POA: Diagnosis not present

## 2020-11-23 DIAGNOSIS — I712 Thoracic aortic aneurysm, without rupture: Secondary | ICD-10-CM | POA: Diagnosis not present

## 2020-11-23 DIAGNOSIS — Z122 Encounter for screening for malignant neoplasm of respiratory organs: Secondary | ICD-10-CM | POA: Diagnosis not present

## 2020-11-23 DIAGNOSIS — R0602 Shortness of breath: Secondary | ICD-10-CM | POA: Diagnosis not present

## 2020-11-23 DIAGNOSIS — E279 Disorder of adrenal gland, unspecified: Secondary | ICD-10-CM | POA: Insufficient documentation

## 2020-12-03 ENCOUNTER — Other Ambulatory Visit: Payer: Self-pay | Admitting: Cardiology

## 2020-12-07 ENCOUNTER — Telehealth: Payer: Self-pay

## 2020-12-07 NOTE — Telephone Encounter (Signed)
-----   Message from Netta Neat., NP sent at 11/27/2020  5:16 PM EDT ----- Please call the patient and let her know the CT scan of her chest showed evidence of emphysema and plaque in her aorta. 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 No evidence of lung cancer. 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 The radiologist who read the report recommend an annual CT to monitor the aorta.

## 2020-12-07 NOTE — Telephone Encounter (Signed)
Spoke to pt's daughter who verbalized understanding.

## 2020-12-26 NOTE — Progress Notes (Signed)
Cardiology Office Note  Date: 12/27/2020   ID: Melissa Mccall, DOB 09-10-1950, MRN [id number]  PCP:  Remigio Eisenmenger, MD  Cardiologist:  Nona Dell, MD Electrophysiologist:  None   Chief Complaint: Follow-up CAD, HLD, HTN, tobacco abuse  History of Present Illness: Melissa Mccall is a 70 y.o. female with a history of  CAD (BMS to LAD 2013), HLD, HTN, tobacco abuse.  Last encounter with Dr. Diona Browner on 04/15/2019 via telemedicine.  At that time she did not report any obvious anginal symptoms.  She had been taking her medications regularly including her statin therapy which she had not been consistent with in the past.  She was due for follow-up lab work.  She continued to smoke but was trying to quit again.  She continued on her antihypertensive medicine including Norvasc and Cozaar.  Follow-up FLP and LFTs were ordered.  She did not follow-up with having the labs drawn.  At a previous visit she presented with complaints of significant exertional fatigue and episodes of chest pain with and without exertion.  She had a recent episode at a family event of left-sided chest pain lasting for approximately 15 minutes which eventually resolved without taking nitroglycerin.  Stated the pain originated on her left side but radiated to mid chest.  She had no radiation to her neck, arms, back, or jaw.  She had no associated nausea, vomiting, or diaphoresis.  She continued to smoke.  She never had a pulmonary consultation or PFTs to examine the extent of likely COPD.  She denied any other issues with CVA or TIA-like symptoms, orthostatic symptoms, palpitations or arrhythmias, blood in the stool, PND or orthopnea, lower extremity edema or claudication-like symptoms.  Saw Dr Sherene Sires 04/18/2020: complaining of SOB, dry productive cough for awhile but had recently gotten worse. He emphasized stopping smoking. Chest xray ordered. 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 Indicated CT contrast was  needed. He mentioned she was eligible for lung ca screening and to let him know if she wanted screening done. She was to use Symbicort 160 up to 2 puffs q 12 hours. He recommended Covid vaccine.  She was last here for 40-month follow-up.  Her daughter stated she had stopped taking her Symbicort stating it did not seem to work.  She had stopped taking her Protonix and famotidine stating it did not feel as though these medications were working either.  She continued to have some coughing and some shortness of breath.  Her daughter stated she recently had the flu although she had no formal testing to diagnose influenza or COVID.  She continued to smoke in spite of being urged on multiple occasions to stop.  She complained of left subcostal pain which would come and go and not associated with exertion.  She denied any radiation or associated nausea, vomiting, or diaphoresis.  Dr. Sherene Sires had mentioned a CT for lung cancer screening.  She had apparently deferred at that point but wanted to proceed with CT at visit.  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  He also recommended she have the COVID-vaccine.  Daughter stated she did not want to discuss having the vaccine.  Daughter stated she continued to have some coughing since the flulike syndrome she had a few weeks prior.  She is here today for follow-up on recent CT scan which Dr. Sherene Sires had wanted her to undergo.  She had agreed at last office visit  here to have CT done for lung cancer screening. CT of the chest on 11/26/2020 demonstrated evidence of mild centrilobular emphysema disc disease with linear scarring/atelectasis over the lingula.  Evidence of emphysema and aortic atherosclerosis.  Atherosclerotic CAD with suggestion of stent over left anterior descending artery.  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  Ascending thoracic aorta measured 3.6 cm.  Recommended annual imaging by CT or  MRA.  She continues with DOE on exertion and states she has to stop at times to rest before proceeding due to chronic DOE.  She continues to smoke and describes smoking approximately 3 cigarettes/day.  She denies any anginal symptoms, palpitations or arrhythmias, orthostatic symptoms, CVA or TIA-like symptoms.  Past Medical History:  Diagnosis Date   Bruising    Aspirin and Effient, February 2013   CAD (coronary artery disease)    BMS to proximal LAD 06/2011   Cocaine abuse (HCC)    Drug resistance    P2Y12 was 311 (>208) January 2013, therefore Effient (Prasugrel) used.   Essential hypertension    Marijuana abuse    NSTEMI (non-ST elevated myocardial infarction) [facility](HCC) January 2013    Past Surgical History:  Procedure Laterality Date   CESAREAN SECTION     x 2   LEFT HEART CATHETERIZATION WITH CORONARY ANGIOGRAM N/A 07/16/2011   Procedure: LEFT HEART CATHETERIZATION WITH CORONARY ANGIOGRAM;  Surgeon: Kathleene Hazelhristopher D [name], MD;  [facility];  Service: Cardiovascular;  Laterality: N/A;   PERCUTANEOUS CORONARY STENT INTERVENTION (PCI-S) N/A 07/16/2011   Procedure: PERCUTANEOUS CORONARY STENT INTERVENTION (PCI-S);  Surgeon: Kathleene Hazelhristopher D [name], MD;  [facility];  Service: Cardiovascular;  Laterality: N/A;    Current Outpatient Medications  Medication Sig Dispense Refill   amLODipine (NORVASC) 10 MG tablet TAKE 1 TABLET BY MOUTH EVERY DAY 30 tablet 6   aspirin EC 81 MG tablet Take 81 mg by mouth daily.     atorvastatin (LIPITOR) 40 MG tablet Take 1 tablet (40 mg total) by mouth daily. 90 tablet 3   budesonide-formoterol (SYMBICORT) 160-4.5 MCG/ACT inhaler Take 2 puffs first thing in am and then another 2 puffs about 12 hours later. 1 each 12   famotidine (PEPCID) 20 MG tablet One after supper 30 tablet 11   losartan (COZAAR) 100 MG tablet Take 1 tablet (100 mg total) by mouth daily. 90 tablet 3   nitroGLYCERIN (NITROSTAT) 0.4 MG SL tablet PLACE 1 TABLET (0.4 MG  TOTAL) UNDER THE TONGUE EVERY 5 (FIVE) MINUTES AS NEEDED FOR CHEST PAIN (UP TO 3 DOSES). 25 tablet 3   pantoprazole (PROTONIX) 40 MG tablet Take 1 tablet (40 mg total) by mouth daily. Take 30-60 min before first meal of the day 30 tablet 2   No current facility-administered medications for this visit.   Allergies:  Patient has no known allergies.   Social History: The patient  reports that she has been smoking cigarettes. She started smoking about 51 years ago. She has a 10.00 pack-year smoking history. She has never used smokeless tobacco. She reports current alcohol use. She reports current drug use. Drug: Marijuana.   Family History: The patient's family history includes Colon cancer in her brother; Diabetes in her mother; Hypertension in her father and mother.   ROS:  Please see the history of present illness. Otherwise, complete review of systems is positive for none.  All other systems are reviewed and negative.   Physical Exam: VS:  BP 130/82   Pulse 68   Ht  5\' 4"  (1.626 m)   Wt 125 lb (56.7 kg)   SpO2 96%   BMI 21.46 kg/m , BMI Body mass index is 21.46 kg/m.  Wt Readings from Last 3 Encounters:  12/27/20 125 lb (56.7 kg)  11/11/20 122 lb 12.8 oz (55.7 kg)  04/18/20 124 lb 9.6 oz (56.5 kg)    General: Patient appears comfortable at rest. Neck: Supple, no elevated JVP or carotid bruits, left lobe thyromegaly. Lungs: Clear to auscultation, prolonged expiratory phase.  Nonlabored breathing at rest. Cardiac: Regular rate and rhythm, no S3 or significant systolic murmur, no pericardial rub. Extremities: No pitting edema, distal pulses 2+. Skin: Warm and dry. Musculoskeletal: No kyphosis. Neuropsychiatric: Alert and oriented x3, affect grossly appropriate.  ECG: 11/11/2020 normal sinus rhythm rate of 77, biatrial enlargement  Recent Labwork: No results found for requested labs within last 8760 hours.     Component Value Date/Time   CHOL 189 07/15/2011 0525   TRIG 109  07/15/2011 0525   HDL 67 07/15/2011 0525   CHOLHDL 2.8 07/15/2011 0525   VLDL 22 07/15/2011 0525   LDLCALC 100 (H) 07/15/2011 0525    Other Studies Reviewed Today:  CT chest 11/26/2020 IMPRESSION: 1. No acute cardiopulmonary disease. 2. Mild centrilobular emphysematous disease. Linear scarring/atelectasis over the lingula. 3. Emphysema and aortic atherosclerosis. Atherosclerotic coronary artery disease with suggestion of a stent over the left anterior descending coronary artery. 4. 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 5. Ascending thoracic aorta measures 3.6 cm. Recommend annual imaging followup by CTA or MRA. This recommendation follows 2010 ACCF/AHA/AATS/ACR/ASA/SCA/SCAI/SIR/STS/SVM Guidelines for the Diagnosis and Management of Patients with Thoracic Aortic Disease. Circulation.2010; 1212011. Aortic aneurysm NOS (ICD10-I71.9).   Aortic Atherosclerosis (ICD10-I70.0) and Emphysema (ICD10-J43.9).   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 Chest CT with contrast is recommended for further evaluation   NST 11/27/2019 Study Result  Narrative & Impression    There was no ST segment deviation noted during stress. The study is normal. This is a low risk study. The left ventricular ejection fraction is normal (55-65%).   Normal resting and stress perfusion. No ischemia or infarction EF 60%      Echo 11/27/2019  1. Left ventricular ejection fraction, by estimation, is 60 to 65%. The left ventricle has normal function. The left ventricle has no regional wall motion abnormalities. There is mild left ventricular hypertrophy. Left ventricular diastolic parameters were normal. 2. Right ventricular systolic function is normal. The right  ventricular size is normal. 3. The mitral valve is normal in structure. No evidence of mitral valve regurgitation. No evidence of mitral stenosis. 4. The aortic valve is normal in structure. Aortic valve regurgitation is not visualized. No aortic stenosis is present. 5. The inferior vena cava is normal in size with greater than 50% respiratory variability, suggesting right atrial pressure of 3 mmHg.    Echocardiogram 07/16/2011 Study Conclusions   Left ventricle: Technically difficult study. The cavity size  was normal. Wall thickness was increased in a pattern of  moderate LVH. The estimated ejection fraction was 60%. Wall  motion was normal; there were no regional wall motion  abnormalities. Transthoracic echocardiography.   Assessment and Plan:   1. CAD in native artery BMS to proximal LAD in 2013.  At last visit she stated she had been having left-sided chest pain which  sometimes radiated to the center of her chest.  Today she denies any anginal symptoms.  Previous nuclear stress test was negative.  Continue nitroglycerin as needed for chest pain.  Continue aspirin 81 mg.  2. Tobacco abuse At last visit she had planned on stopping smoking.  She continues to smoke approximately 3 cigarettes daily  She did not stop as discussed at last visit.  Highly advised her to stop smoking. Continues with  SOB, long history of smoking.  She saw Dr. Sherene Sires recently who recommended CT to screen for lung cancer.  Her PFTs as noted above mentioned moderate obstructive airway disease with reversible component, moderately severe diffusion defect.  CT scan on 11/26/2020 showed no acute cardiopulmonary disease, mild centrilobular emphysema's disease, linear scarring/atelectasis over the lingula.  Emphysema and aortic atherosclerosis, atherosclerotic coronary artery disease suggesting of a stent over the left anterior descending artery.  1.5 cm left adrenal mass likely adenoma.  Ascending thoracic aorta measuring  3.6 cm.  Highly advised her to stop Smoking as soon as possible.  She verbalizes understanding.   3. Mixed hyperlipidemia Total cholesterol 221, triglycerides 85, HDL 86, LDL 118. Goal is LDL < 70. Increase atorvastatin to 40 mg po daily get FLP and LFT and 8-12 weeks  after increasing dosage.  4. Essential hypertension  Blood pressure today BP today 130/82.  Continue amlodipine 10 mg, and losartan 100 mg daily.  5.  Thyroid enlargement Patient has an enlarged area on the left side of her trachea which appears to be left lobe of thyroid.  Also complaining of exertional fatigue.  TSH 1.078.  Dr. 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  She stated at last visit she did not proceed with following through for CT. I encouraged her to have CT which she had done.  Recent CT results noted above.  There was no mention of mediastinal mass on CT scan.  6.  Ascending aorta dilation Recent CT demonstrated evidence of dilated thoracic ascending Aorta measuring 3.6 cm. Recommended annual imaging to follow.  Medication Adjustments/Labs and Tests Ordered: Current medicines are reviewed at length with the patient today.  Concerns regarding medicines are outlined above.   Disposition: Follow-up with Dr. Diona Browner or APP 6 months  Signed, Rennis Harding, NP 12/27/2020 5:39 PM    [facility] [address] [place], [place], [place] [address] Phone: [phone]; Fax: [phone]

## 2020-12-27 ENCOUNTER — Ambulatory Visit (INDEPENDENT_AMBULATORY_CARE_PROVIDER_SITE_OTHER): Payer: Medicare Other | Admitting: Family Medicine

## 2020-12-27 ENCOUNTER — Encounter: Payer: Self-pay | Admitting: Family Medicine

## 2020-12-27 VITALS — BP 130/82 | HR 68 | Ht 64.0 in | Wt 125.0 lb

## 2020-12-27 DIAGNOSIS — I2510 Atherosclerotic heart disease of native coronary artery without angina pectoris: Secondary | ICD-10-CM

## 2020-12-27 DIAGNOSIS — I77810 Thoracic aortic ectasia: Secondary | ICD-10-CM

## 2020-12-27 DIAGNOSIS — I10 Essential (primary) hypertension: Secondary | ICD-10-CM | POA: Diagnosis not present

## 2020-12-27 DIAGNOSIS — E782 Mixed hyperlipidemia: Secondary | ICD-10-CM

## 2020-12-27 DIAGNOSIS — E049 Nontoxic goiter, unspecified: Secondary | ICD-10-CM

## 2020-12-27 DIAGNOSIS — Z720 Tobacco use: Secondary | ICD-10-CM | POA: Diagnosis not present

## 2020-12-27 NOTE — Patient Instructions (Signed)
Medication Instructions:  Continue all current medications.   Labwork: none  Testing/Procedures: none  Follow-Up: 6 months   Any Other Special Instructions Will Be Listed Below (If Applicable).   If you need a refill on your cardiac medications before your next appointment, please call your pharmacy.  

## 2020-12-30 ENCOUNTER — Other Ambulatory Visit: Payer: Self-pay | Admitting: Cardiology

## 2020-12-30 ENCOUNTER — Ambulatory Visit: Payer: Medicare Other | Admitting: Family Medicine

## 2021-01-04 ENCOUNTER — Other Ambulatory Visit: Payer: Self-pay | Admitting: Family Medicine

## 2021-01-14 ENCOUNTER — Other Ambulatory Visit: Payer: Self-pay | Admitting: Family Medicine

## 2021-01-17 ENCOUNTER — Other Ambulatory Visit: Payer: Self-pay | Admitting: Internal Medicine

## 2021-01-17 DIAGNOSIS — R0609 Other forms of dyspnea: Secondary | ICD-10-CM

## 2021-01-17 DIAGNOSIS — R0600 Dyspnea, unspecified: Secondary | ICD-10-CM

## 2021-02-28 ENCOUNTER — Other Ambulatory Visit: Payer: Self-pay | Admitting: *Deleted

## 2021-02-28 ENCOUNTER — Encounter: Payer: Self-pay | Admitting: *Deleted

## 2021-02-28 DIAGNOSIS — E782 Mixed hyperlipidemia: Secondary | ICD-10-CM

## 2021-04-11 ENCOUNTER — Other Ambulatory Visit: Payer: Self-pay | Admitting: Internal Medicine

## 2021-04-11 DIAGNOSIS — R0609 Other forms of dyspnea: Secondary | ICD-10-CM

## 2021-05-14 ENCOUNTER — Other Ambulatory Visit: Payer: Self-pay | Admitting: Internal Medicine

## 2021-05-16 ENCOUNTER — Telehealth: Payer: Self-pay | Admitting: Cardiology

## 2021-05-16 NOTE — Telephone Encounter (Signed)
   Pt's daughter would like to speak with Dr. Diona Browner, she said pt been having CP and would like to discuss previous tests done

## 2021-05-16 NOTE — Telephone Encounter (Signed)
Left message to return call 

## 2021-05-17 NOTE — Telephone Encounter (Signed)
Per daughter, patient has been reporting chest pain for the past year and says that it seems to be coming more frequently Denies SOB or dizziness Per daughter, has nitroglycerin but refuses to use it Gave 1st available appointment to see Ward Givens 06/13/21 @2 :30 pm Advised to use nitro as needed Advised if symptoms get worse, to go to the ED for an evaluation

## 2021-05-17 NOTE — Telephone Encounter (Signed)
Pt returning call / please advise  

## 2021-05-19 ENCOUNTER — Other Ambulatory Visit: Payer: Self-pay | Admitting: Family Medicine

## 2021-05-25 ENCOUNTER — Other Ambulatory Visit: Payer: Self-pay | Admitting: Internal Medicine

## 2021-05-25 ENCOUNTER — Other Ambulatory Visit: Payer: Self-pay | Admitting: Cardiology

## 2021-05-25 ENCOUNTER — Other Ambulatory Visit: Payer: Self-pay

## 2021-05-25 ENCOUNTER — Telehealth: Payer: Self-pay | Admitting: Cardiology

## 2021-05-25 ENCOUNTER — Encounter: Payer: Self-pay | Admitting: Cardiology

## 2021-05-25 ENCOUNTER — Ambulatory Visit (INDEPENDENT_AMBULATORY_CARE_PROVIDER_SITE_OTHER): Payer: Medicare Other | Admitting: Cardiology

## 2021-05-25 ENCOUNTER — Other Ambulatory Visit: Payer: Self-pay | Admitting: *Deleted

## 2021-05-25 VITALS — BP 116/80 | HR 73 | Ht 64.0 in | Wt 117.8 lb

## 2021-05-25 DIAGNOSIS — R0609 Other forms of dyspnea: Secondary | ICD-10-CM

## 2021-05-25 DIAGNOSIS — I200 Unstable angina: Secondary | ICD-10-CM

## 2021-05-25 DIAGNOSIS — R079 Chest pain, unspecified: Secondary | ICD-10-CM

## 2021-05-25 DIAGNOSIS — Z01810 Encounter for preprocedural cardiovascular examination: Secondary | ICD-10-CM

## 2021-05-25 DIAGNOSIS — I25119 Atherosclerotic heart disease of native coronary artery with unspecified angina pectoris: Secondary | ICD-10-CM | POA: Diagnosis not present

## 2021-05-25 DIAGNOSIS — E782 Mixed hyperlipidemia: Secondary | ICD-10-CM | POA: Diagnosis not present

## 2021-05-25 DIAGNOSIS — I10 Essential (primary) hypertension: Secondary | ICD-10-CM | POA: Diagnosis not present

## 2021-05-25 DIAGNOSIS — Z01818 Encounter for other preprocedural examination: Secondary | ICD-10-CM | POA: Diagnosis not present

## 2021-05-25 MED ORDER — SODIUM CHLORIDE 0.9% FLUSH: 3.0000 mL | Freq: Two times a day (BID) | INTRAVENOUS | Status: DC

## 2021-05-25 NOTE — Telephone Encounter (Signed)
PERCERT:  Left Heart Cath dx: accelerating angina Wednesday, 05/31/21 @1 :00 pm Dr. 

## 2021-05-25 NOTE — H&P (View-Only) (Signed)
  Cardiology Office Note  Date: 05/25/2021   ID: Melissa Mccall, DOB 01/23/1951, MRN [id number]  PCP:  Balakrishnan, Shyam E, MD  Cardiologist:  Rasheda Ledger, MD Electrophysiologist:  None   Chief Complaint  Patient presents with   Cardiac follow-up    History of Present Illness: Melissa Mccall is a 70 y.o. female last seen in July by Mr. Quinn NP.  She is here today with her daughter for a follow-up visit.  She tells me that she has been having recurrent episodes of a squeezing sensation in her chest, as if someone is holding her tightly.  This happens sporadically, sometimes with exertion, she has to stop what she is doing and sit still.  She has not taken nitroglycerin.  She feels like these episodes are getting more frequent and intense.  Her daughter who is a nurse practitioner in a pediatric practice in [place] witnessed one of these episodes on Thanksgiving.  She has a history of CAD status post BMS to the proximal LAD in 2013.  Ischemic testing from June 2021 was low risk.  She reports compliance with medications as noted below.  Echocardiogram and Myoview from June 2021 are noted below.  I reviewed her medications.  I personally reviewed her ECG today which shows normal sinus rhythm with possible biatrial enlargement.  She has not had recent lab work for review.  Past Medical History:  Diagnosis Date   Bruising    Aspirin and Effient, February 2013   CAD (coronary artery disease)    BMS to proximal LAD 06/2011   Cocaine abuse (HCC)    Drug resistance    P2Y12 was 311 (>208) January 2013, therefore Effient (Prasugrel) used.   Essential hypertension    Marijuana abuse    NSTEMI (non-ST elevated myocardial infarction) (HCC) January 2013    Past Surgical History:  Procedure Laterality Date   CESAREAN SECTION     x 2   LEFT HEART CATHETERIZATION WITH CORONARY ANGIOGRAM N/A 07/16/2011   Procedure: LEFT HEART CATHETERIZATION WITH CORONARY ANGIOGRAM;  Surgeon:  [name] D [name], MD;  Location: [facility] CATH LAB;  Service: Cardiovascular;  Laterality: N/A;   PERCUTANEOUS CORONARY STENT INTERVENTION (PCI-S) N/A 07/16/2011   Procedure: PERCUTANEOUS CORONARY STENT INTERVENTION (PCI-S);  Surgeon: [name] D [name], MD;  Location: [facility] CATH LAB;  Service: Cardiovascular;  Laterality: N/A;    Current Outpatient Medications  Medication Sig Dispense Refill   amLODipine (NORVASC) 10 MG tablet TAKE 1 TABLET BY MOUTH EVERY DAY 90 tablet 2   aspirin EC 81 MG tablet Take 81 mg by mouth daily.     atorvastatin (LIPITOR) 40 MG tablet TAKE 1 TABLET BY MOUTH EVERY DAY 90 tablet 3   budesonide-formoterol (SYMBICORT) 160-4.5 MCG/ACT inhaler Take 2 puffs first thing in am and then another 2 puffs about 12 hours later. 1 each 12   famotidine (PEPCID) 20 MG tablet One after supper 30 tablet 11   losartan (COZAAR) 100 MG tablet TAKE 1 TABLET BY MOUTH EVERY DAY 90 tablet 3   nitroGLYCERIN (NITROSTAT) 0.4 MG SL tablet PLACE 1 TABLET (0.4 MG TOTAL) UNDER THE TONGUE EVERY 5 (FIVE) MINUTES AS NEEDED FOR CHEST PAIN (UP TO 3 DOSES). 25 tablet 3   pantoprazole (PROTONIX) 40 MG tablet TAKE 1 TABLET (40 MG TOTAL) BY MOUTH DAILY. TAKE 30-60 MIN BEFORE FIRST MEAL OF THE DAY 90 tablet 0   No current facility-administered medications for this visit.   Allergies:  Patient has no known allergies.   Social   History: The patient  reports that she has been smoking cigarettes. She started smoking about 51 years ago. She has a 10.00 pack-year smoking history. She has never used smokeless tobacco. She reports current alcohol use. She reports current drug use. Drug: Marijuana.   Family History: The patient's family history includes Colon cancer in her brother; Diabetes in her mother; Hypertension in her father and mother.   ROS: No palpitations or syncope.  Physical Exam: VS:  BP 116/80   Pulse 73   Ht 5' 4" (1.626 m)   Wt 117 lb 12.8 oz (53.4 kg)   SpO2 96%   BMI 20.22 kg/m , BMI Body  mass index is 20.22 kg/m.  Wt Readings from Last 3 Encounters:  05/25/21 117 lb 12.8 oz (53.4 kg)  12/27/20 125 lb (56.7 kg)  11/11/20 122 lb 12.8 oz (55.7 kg)    General: Patient appears comfortable at rest. HEENT: Conjunctiva and lids normal, wearing a mask. Neck: Supple, no elevated JVP or carotid bruits, no thyromegaly. Lungs: Clear to auscultation, nonlabored breathing at rest. Cardiac: Regular rate and rhythm, no S3 or significant systolic murmur, no pericardial rub. Abdomen: Soft, bowel sounds present. Extremities: No pitting edema, distal pulses 2+. Skin: Warm and dry. Musculoskeletal: No kyphosis. Neuropsychiatric: Alert and oriented x3, affect grossly appropriate.  ECG:  An ECG dated 11/11/2020 was personally reviewed today and demonstrated:  Sinus rhythm with biatrial enlargement.  Recent Labwork:  May 2021: AST 18, ALT 27, cholesterol 221, triglycerides 85, HDL 86, LDL 118, TSH 1.078  Other Studies Reviewed Today:  Echocardiogram 11/27/2019:  1. Left ventricular ejection fraction, by estimation, is 60 to 65%. The  left ventricle has normal function. The left ventricle has no regional  wall motion abnormalities. There is mild left ventricular hypertrophy.  Left ventricular diastolic parameters  were normal.   2. Right ventricular systolic function is normal. The right ventricular  size is normal.   3. The mitral valve is normal in structure. No evidence of mitral valve  regurgitation. No evidence of mitral stenosis.   4. The aortic valve is normal in structure. Aortic valve regurgitation is  not visualized. No aortic stenosis is present.   5. The inferior vena cava is normal in size with greater than 50%  respiratory variability, suggesting right atrial pressure of 3 mmHg.   Lexiscan Myoview 11/27/2019: There was no ST segment deviation noted during stress. The study is normal. This is a low risk study. The left ventricular ejection fraction is normal (55-65%).    Normal resting and stress perfusion. No ischemia or infarction EF 60%   Chest CT 11/23/2020: IMPRESSION: 1. No acute cardiopulmonary disease. 2. Mild centrilobular emphysematous disease. Linear scarring/atelectasis over the lingula. 3. Emphysema and aortic atherosclerosis. Atherosclerotic coronary artery disease with suggestion of a stent over the left anterior descending coronary artery. 4. 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 5. Ascending thoracic aorta measures 3.6 cm. Recommend annual imaging followup by CTA or MRA. This recommendation follows 2010 ACCF/AHA/AATS/ACR/ASA/SCA/SCAI/SIR/STS/SVM Guidelines for the Diagnosis and Management of Patients with Thoracic Aortic Disease. Circulation.2010; 121: E266-e369. Aortic aneurysm NOS (ICD10-I71.9).   Aortic Atherosclerosis (ICD10-I70.0) and Emphysema (ICD10-J43.9).  Assessment and Plan:  1.  Recurrent chest pain concerning for accelerating angina in a 70-year-old woman with prior history of BMS to the proximal LAD in 2013.  Ischemic testing from June 2021 was low risk.  Symptoms worse in the last few months at least.  ECG is stable.  She reports compliance with   medical therapy including aspirin, Norvasc, Lipitor, and losartan.  We discussed risks and benefits of a diagnostic cardiac catheterization and she is in agreement to proceed.  Plan to schedule when convenient for the patient's daughter to be present.  Need baseline lab work, no changes in medications for now.  Of note, she did have relative resistance to Plavix and was on Effient with intervention in 2013.  2.  Essential hypertension, currently on Norvasc and losartan with good blood pressure control today.  3.  Asymptomatic, ascending aortic aneurysm of 3.6 cm by chest CTA in June 2022.  4.  Mild centrilobular emphysema.  Medication Adjustments/Labs and Tests Ordered: Current medicines are reviewed at length with the patient today.  Concerns regarding medicines are  outlined above.   Tests Ordered: Orders Placed This Encounter  Procedures   CBC   Basic metabolic panel   EKG 12-Lead     Medication Changes: No orders of the defined types were placed in this encounter.   Disposition:  Follow up  after procedure.  Signed, Finlay Mills G. Saif Peter, MD, FACC 05/25/2021 8:59 AM    [facility] [place] [address], [place], [place] [address] Phone: [phone]; Fax: [phone]  

## 2021-05-25 NOTE — Patient Instructions (Signed)
Medication Instructions:  Your physician recommends that you continue on your current medications as directed. Please refer to the Current Medication list given to you today.  Labwork: BMET & CBC today at [facility] or [facility]  Testing/Procedures: Your physician has requested that you have a cardiac catheterization. Cardiac catheterization is used to diagnose and/or treat various heart conditions. Doctors may recommend this procedure for a number of different reasons. The most common reason is to evaluate chest pain. Chest pain can be a symptom of coronary artery disease (CAD), and cardiac catheterization can show whether plaque is narrowing or blocking your heart's arteries. This procedure is also used to evaluate the valves, as well as measure the blood flow and oxygen levels in different parts of your heart. For further information please visit [email or web address] Please follow instruction sheet, as given.  Follow-Up: Your physician recommends that you schedule a follow-up appointment in: 1 month  Any Other Special Instructions Will Be Listed Below (If Applicable).  If you need a refill on your cardiac medications before your next appointment, please call your pharmacy.   [facility] CARDIOVASCULAR DIVISION [facility] [address] [place] [address] Dept: [phone] Loc: [phone]  Melissa Mccall  05/25/2021  You are scheduled for a Cardiac Catheterization on Wednesday, December 14 with Dr. Bryan Lemma.  1. Please arrive at the [facility] (Main Entrance A) at [facility]: [address] [place], [place] [address] at 11:00 AM (This time is two hours before your procedure to ensure your preparation). Free valet parking service is available.   Special note: Every effort is made to have your procedure done on time. Please understand that emergencies sometimes delay scheduled  procedures.  2. Diet: Do not eat solid foods after midnight.  The patient may have clear liquids until 5am upon the day of the procedure.  3. Labs: You will need to have blood drawn on Thursday, December 8 at [facility] or [facility]. You do not need to be fasting.  4. Medication instructions in preparation for your procedure: You may take your medications that morning with a sip of water   Contrast Allergy: No  On the morning of your procedure, take your Aspirin 81 mg and any morning medicines NOT listed above.  You may use sips of water.  5. Plan for one night stay--bring personal belongings. 6. Bring a current list of your medications and current insurance cards. 7. You MUST have a responsible person to drive you home. 8. Someone MUST be with you the first 24 hours after you arrive home or your discharge will be delayed. 9. Please wear clothes that are easy to get on and off and wear slip-on shoes.  Thank you for allowing [place] to care for you!   -- [facility]

## 2021-05-25 NOTE — Progress Notes (Signed)
Cardiology Office Note  Date: 05/25/2021   ID: Melissa Mccall, DOB [birth year], MRN [id number]  PCP:  Remigio Eisenmenger, MD  Cardiologist:  Nona Dell, MD Electrophysiologist:  None   Chief Complaint  Patient presents with   Cardiac follow-up    History of Present Illness: Melissa Mccall is a 70 y.o. female last seen in July by Mr. Vincenza Hews NP.  She is here today with her daughter for a follow-up visit.  She tells me that she has been having recurrent episodes of a squeezing sensation in her chest, as if someone is holding her tightly.  This happens sporadically, sometimes with exertion, she has to stop what she is doing and sit still.  She has not taken nitroglycerin.  She feels like these episodes are getting more frequent and intense.  Her daughter who is a Publishing rights manager in a pediatric practice in [place] witnessed one of these episodes on Thanksgiving.  She has a history of CAD status post BMS to the proximal LAD in 2013.  Ischemic testing from June 2021 was low risk.  She reports compliance with medications as noted below.  Echocardiogram and Myoview from June 2021 are noted below.  I reviewed her medications.  I personally reviewed her ECG today which shows normal sinus rhythm with possible biatrial enlargement.  She has not had recent lab work for review.  Past Medical History:  Diagnosis Date   Bruising    Aspirin and Effient, February 2013   CAD (coronary artery disease)    BMS to proximal LAD 06/2011   Cocaine abuse (HCC)    Drug resistance    P2Y12 was 311 (>208) January 2013, therefore Effient (Prasugrel) used.   Essential hypertension    Marijuana abuse    NSTEMI (non-ST elevated myocardial infarction) [facility]) January 2013    Past Surgical History:  Procedure Laterality Date   CESAREAN SECTION     x 2   LEFT HEART CATHETERIZATION WITH CORONARY ANGIOGRAM N/A 07/16/2011   Procedure: LEFT HEART CATHETERIZATION WITH CORONARY ANGIOGRAM;  Surgeon:  Kathleene Hazel, MD;  Location: [facility] CATH LAB;  Service: Cardiovascular;  Laterality: N/A;   PERCUTANEOUS CORONARY STENT INTERVENTION (PCI-S) N/A 07/16/2011   Procedure: PERCUTANEOUS CORONARY STENT INTERVENTION (PCI-S);  Surgeon: Kathleene Hazel, MD;  Location: [facility] CATH LAB;  Service: Cardiovascular;  Laterality: N/A;    Current Outpatient Medications  Medication Sig Dispense Refill   amLODipine (NORVASC) 10 MG tablet TAKE 1 TABLET BY MOUTH EVERY DAY 90 tablet 2   aspirin EC 81 MG tablet Take 81 mg by mouth daily.     atorvastatin (LIPITOR) 40 MG tablet TAKE 1 TABLET BY MOUTH EVERY DAY 90 tablet 3   budesonide-formoterol (SYMBICORT) 160-4.5 MCG/ACT inhaler Take 2 puffs first thing in am and then another 2 puffs about 12 hours later. 1 each 12   famotidine (PEPCID) 20 MG tablet One after supper 30 tablet 11   losartan (COZAAR) 100 MG tablet TAKE 1 TABLET BY MOUTH EVERY DAY 90 tablet 3   nitroGLYCERIN (NITROSTAT) 0.4 MG SL tablet PLACE 1 TABLET (0.4 MG TOTAL) UNDER THE TONGUE EVERY 5 (FIVE) MINUTES AS NEEDED FOR CHEST PAIN (UP TO 3 DOSES). 25 tablet 3   pantoprazole (PROTONIX) 40 MG tablet TAKE 1 TABLET (40 MG TOTAL) BY MOUTH DAILY. TAKE 30-60 MIN BEFORE FIRST MEAL OF THE DAY 90 tablet 0   No current facility-administered medications for this visit.   Allergies:  Patient has no known allergies.   Social  History: The patient  reports that she has been smoking cigarettes. She started smoking about 51 years ago. She has a 10.00 pack-year smoking history. She has never used smokeless tobacco. She reports current alcohol use. She reports current drug use. Drug: Marijuana.   Family History: The patient's family history includes Colon cancer in her brother; Diabetes in her mother; Hypertension in her father and mother.   ROS: No palpitations or syncope.  Physical Exam: VS:  BP 116/80   Pulse 73   Ht 5\' 4"  (1.626 m)   Wt 117 lb 12.8 oz (53.4 kg)   SpO2 96%   BMI 20.22 kg/m , BMI Body  mass index is 20.22 kg/m.  Wt Readings from Last 3 Encounters:  05/25/21 117 lb 12.8 oz (53.4 kg)  12/27/20 125 lb (56.7 kg)  11/11/20 122 lb 12.8 oz (55.7 kg)    General: Patient appears comfortable at rest. HEENT: Conjunctiva and lids normal, wearing a mask. Neck: Supple, no elevated JVP or carotid bruits, no thyromegaly. Lungs: Clear to auscultation, nonlabored breathing at rest. Cardiac: Regular rate and rhythm, no S3 or significant systolic murmur, no pericardial rub. Abdomen: Soft, bowel sounds present. Extremities: No pitting edema, distal pulses 2+. Skin: Warm and dry. Musculoskeletal: No kyphosis. Neuropsychiatric: Alert and oriented x3, affect grossly appropriate.  ECG:  An ECG dated 11/11/2020 was personally reviewed today and demonstrated:  Sinus rhythm with biatrial enlargement.  Recent Labwork:  May 2021: AST 18, ALT 27, cholesterol 221, triglycerides 85, HDL 86, LDL 118, TSH 1.078  Other Studies Reviewed Today:  Echocardiogram 11/27/2019:  1. Left ventricular ejection fraction, by estimation, is 60 to 65%. The  left ventricle has normal function. The left ventricle has no regional  wall motion abnormalities. There is mild left ventricular hypertrophy.  Left ventricular diastolic parameters  were normal.   2. Right ventricular systolic function is normal. The right ventricular  size is normal.   3. The mitral valve is normal in structure. No evidence of mitral valve  regurgitation. No evidence of mitral stenosis.   4. The aortic valve is normal in structure. Aortic valve regurgitation is  not visualized. No aortic stenosis is present.   5. The inferior vena cava is normal in size with greater than 50%  respiratory variability, suggesting right atrial pressure of 3 mmHg.   Lexiscan Myoview 11/27/2019: There was no ST segment deviation noted during stress. The study is normal. This is a low risk study. The left ventricular ejection fraction is normal (55-65%).    Normal resting and stress perfusion. No ischemia or infarction EF 60%   Chest CT 11/23/2020: IMPRESSION: 1. No acute cardiopulmonary disease. 2. Mild centrilobular emphysematous disease. Linear scarring/atelectasis over the lingula. 3. Emphysema and aortic atherosclerosis. Atherosclerotic coronary artery disease with suggestion of a stent over the left anterior descending coronary artery. 4. 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 5. Ascending thoracic aorta measures 3.6 cm. Recommend annual imaging followup by CTA or MRA. This recommendation follows 2010 ACCF/AHA/AATS/ACR/ASA/SCA/SCAI/SIR/STS/SVM Guidelines for the Diagnosis and Management of Patients with Thoracic Aortic Disease. Circulation.2010; 1212011. Aortic aneurysm NOS (ICD10-I71.9).   Aortic Atherosclerosis (ICD10-I70.0) and Emphysema (ICD10-J43.9).  Assessment and Plan:  1.  Recurrent chest pain concerning for accelerating angina in a 70 year old woman with prior history of BMS to the proximal LAD in 2013.  Ischemic testing from June 2021 was low risk.  Symptoms worse in the last few months at least.  ECG is stable.  She reports compliance with  medical therapy including aspirin, Norvasc, Lipitor, and losartan.  We discussed risks and benefits of a diagnostic cardiac catheterization and she is in agreement to proceed.  Plan to schedule when convenient for the patient's daughter to be present.  Need baseline lab work, no changes in medications for now.  Of note, she did have relative resistance to Plavix and was on Effient with intervention in 2013.  2.  Essential hypertension, currently on Norvasc and losartan with good blood pressure control today.  3.  Asymptomatic, ascending aortic aneurysm of 3.6 cm by chest CTA in June 2022.  4.  Mild centrilobular emphysema.  Medication Adjustments/Labs and Tests Ordered: Current medicines are reviewed at length with the patient today.  Concerns regarding medicines are  outlined above.   Tests Ordered: Orders Placed This Encounter  Procedures   CBC   Basic metabolic panel   EKG 12-Lead     Medication Changes: No orders of the defined types were placed in this encounter.   Disposition:  Follow up  after procedure.  Signed, Jonelle Sidle, MD, [facility] 05/25/2021 8:59 AM    [facility] at [facility] [address] [place], [place], [place] [address] Phone: [phone]; Fax: [phone]

## 2021-05-29 ENCOUNTER — Telehealth: Payer: Self-pay | Admitting: *Deleted

## 2021-05-29 NOTE — Telephone Encounter (Signed)
Cardiac catheterization scheduled at [facility] for: Wednesday May 31, 2021 1 PM Arrive [facility] Main Entrance A [facility]) at: 11 AM   Diet-no solid food after midnight prior to cath, clear liquids until 5 AM day of procedure.   Medication instructions for procedure: -Usual morning medications can be taken pre-cath with sips of water including aspirin 81 mg.    Confirmed patient has responsible adult to drive home post procedure and be with patient first 24 hours after arriving home.  [facility] does allow one visitor to accompany you and wait in the hospital waiting room while you are there for your procedure. You and your visitor will be asked to wear a mask once you enter the hospital.   Patient reports does not currently have any new symptoms concerning for COVID-19 and no household members with COVID-19 like illness.     Call placed to patient to review procedure instructions, received message at mobile number for patient, mailbox full, unable to leave message.

## 2021-05-30 ENCOUNTER — Other Ambulatory Visit: Payer: Self-pay | Admitting: Internal Medicine

## 2021-05-31 ENCOUNTER — Other Ambulatory Visit (HOSPITAL_COMMUNITY): Payer: Self-pay

## 2021-05-31 ENCOUNTER — Encounter (HOSPITAL_COMMUNITY)
Admission: RE | Disposition: A | Discharge status: Home / Self Care | Payer: Self-pay | Source: Home / Self Care | Attending: Cardiology

## 2021-05-31 ENCOUNTER — Other Ambulatory Visit: Payer: Self-pay

## 2021-05-31 ENCOUNTER — Ambulatory Visit (HOSPITAL_COMMUNITY)
Admission: RE | Admit: 2021-05-31 | Discharge: 2021-05-31 | Disposition: A | Discharge status: Home / Self Care | Payer: Medicare Other | Attending: Cardiology | Admitting: Cardiology

## 2021-05-31 DIAGNOSIS — E785 Hyperlipidemia, unspecified: Secondary | ICD-10-CM | POA: Insufficient documentation

## 2021-05-31 DIAGNOSIS — Z79899 Other long term (current) drug therapy: Secondary | ICD-10-CM | POA: Insufficient documentation

## 2021-05-31 DIAGNOSIS — I7121 Aneurysm of the ascending aorta, without rupture: Secondary | ICD-10-CM | POA: Diagnosis not present

## 2021-05-31 DIAGNOSIS — J432 Centrilobular emphysema: Secondary | ICD-10-CM | POA: Insufficient documentation

## 2021-05-31 DIAGNOSIS — I10 Essential (primary) hypertension: Secondary | ICD-10-CM | POA: Diagnosis not present

## 2021-05-31 DIAGNOSIS — I200 Unstable angina: Secondary | ICD-10-CM | POA: Diagnosis present

## 2021-05-31 DIAGNOSIS — I25119 Atherosclerotic heart disease of native coronary artery with unspecified angina pectoris: Secondary | ICD-10-CM | POA: Diagnosis present

## 2021-05-31 DIAGNOSIS — Z955 Presence of coronary angioplasty implant and graft: Secondary | ICD-10-CM

## 2021-05-31 DIAGNOSIS — I25110 Atherosclerotic heart disease of native coronary artery with unstable angina pectoris: Secondary | ICD-10-CM | POA: Insufficient documentation

## 2021-05-31 HISTORY — PX: CORONARY STENT INTERVENTION: CATH118234

## 2021-05-31 HISTORY — PX: LEFT HEART CATH AND CORONARY ANGIOGRAPHY: CATH118249

## 2021-05-31 LAB — POCT ACTIVATED CLOTTING TIME
Activated Clotting Time: 305 seconds
Activated Clotting Time: 341 seconds

## 2021-05-31 SURGERY — LEFT HEART CATH AND CORONARY ANGIOGRAPHY
Anesthesia: LOCAL

## 2021-05-31 MED ORDER — SODIUM CHLORIDE 0.9% FLUSH: 3.0000 mL | INTRAVENOUS | Status: DC | PRN

## 2021-05-31 MED ORDER — HYDRALAZINE HCL 20 MG/ML IJ SOLN: 10.0000 mg | INTRAMUSCULAR | Status: DC | PRN

## 2021-05-31 MED ORDER — HEPARIN SODIUM (PORCINE) 1000 UNIT/ML IJ SOLN
INTRAMUSCULAR | Status: AC
  Filled 2021-05-31: qty 10

## 2021-05-31 MED ORDER — HEPARIN SODIUM (PORCINE) 1000 UNIT/ML IJ SOLN
INTRAMUSCULAR | Status: DC | PRN
  Administered 2021-05-31: 2500 [IU] via INTRAVENOUS
  Administered 2021-05-31: 3000 [IU] via INTRAVENOUS
  Administered 2021-05-31: 2000 [IU] via INTRAVENOUS

## 2021-05-31 MED ORDER — SODIUM CHLORIDE 0.9% FLUSH: 3.0000 mL | Freq: Two times a day (BID) | INTRAVENOUS | Status: DC

## 2021-05-31 MED ORDER — HEPARIN (PORCINE) IN NACL 2000-0.9 UNIT/L-% IV SOLN
INTRAVENOUS | Status: DC | PRN
  Administered 2021-05-31: 1000 mL

## 2021-05-31 MED ORDER — FENTANYL CITRATE (PF) 100 MCG/2ML IJ SOLN
INTRAMUSCULAR | Status: DC | PRN
  Administered 2021-05-31 (×3): 25 ug via INTRAVENOUS

## 2021-05-31 MED ORDER — NITROGLYCERIN 1 MG/10 ML FOR IR/CATH LAB
INTRA_ARTERIAL | Status: DC | PRN
  Administered 2021-05-31 (×2): 200 ug

## 2021-05-31 MED ORDER — ONDANSETRON HCL 4 MG/2ML IJ SOLN: 4.0000 mg | Freq: Four times a day (QID) | INTRAMUSCULAR | Status: DC | PRN

## 2021-05-31 MED ORDER — ASPIRIN 81 MG PO CHEW: 81.0000 mg | CHEWABLE_TABLET | ORAL | Status: DC

## 2021-05-31 MED ORDER — SODIUM CHLORIDE 0.9 % IV SOLN: INTRAVENOUS | Status: DC

## 2021-05-31 MED ORDER — MIDAZOLAM HCL 2 MG/2ML IJ SOLN
INTRAMUSCULAR | Status: AC
  Filled 2021-05-31: qty 2

## 2021-05-31 MED ORDER — SODIUM CHLORIDE 0.9 % IV SOLN: 250.0000 mL | INTRAVENOUS | Status: DC | PRN

## 2021-05-31 MED ORDER — IOHEXOL 350 MG/ML SOLN
INTRAVENOUS | Status: DC | PRN
  Administered 2021-05-31: 14:00:00 160 mL

## 2021-05-31 MED ORDER — VERAPAMIL HCL 2.5 MG/ML IV SOLN
INTRAVENOUS | Status: AC
  Filled 2021-05-31: qty 2

## 2021-05-31 MED ORDER — CLOPIDOGREL BISULFATE 300 MG PO TABS
ORAL_TABLET | ORAL | Status: AC
  Filled 2021-05-31: qty 2

## 2021-05-31 MED ORDER — CLOPIDOGREL BISULFATE 300 MG PO TABS
ORAL_TABLET | ORAL | Status: DC | PRN
  Administered 2021-05-31: 600 mg via ORAL

## 2021-05-31 MED ORDER — TICAGRELOR 90 MG PO TABS: 90.0000 mg | ORAL_TABLET | Freq: Two times a day (BID) | ORAL | Status: DC

## 2021-05-31 MED ORDER — HEPARIN (PORCINE) IN NACL 2000-0.9 UNIT/L-% IV SOLN
INTRAVENOUS | Status: AC
  Filled 2021-05-31: qty 1000

## 2021-05-31 MED ORDER — LABETALOL HCL 5 MG/ML IV SOLN: 10.0000 mg | INTRAVENOUS | Status: DC | PRN

## 2021-05-31 MED ORDER — SODIUM CHLORIDE 0.9 % WEIGHT BASED INFUSION
3.0000 mL/kg/h | INTRAVENOUS | Status: AC
  Administered 2021-05-31: 11:00:00 3 mL/kg/h via INTRAVENOUS

## 2021-05-31 MED ORDER — TICAGRELOR 90 MG PO TABS
90.0000 mg | ORAL_TABLET | Freq: Two times a day (BID) | ORAL | 0 refills | Status: DC
  Filled 2021-05-31: qty 60, 30d supply, fill #0

## 2021-05-31 MED ORDER — SODIUM CHLORIDE 0.9 % IV SOLN
INTRAVENOUS | Status: DC | PRN
  Administered 2021-05-31: 500 mL via INTRAVENOUS

## 2021-05-31 MED ORDER — FENTANYL CITRATE (PF) 100 MCG/2ML IJ SOLN
INTRAMUSCULAR | Status: AC
  Filled 2021-05-31: qty 2

## 2021-05-31 MED ORDER — ACETAMINOPHEN 325 MG PO TABS: 650.0000 mg | ORAL_TABLET | ORAL | Status: DC | PRN

## 2021-05-31 MED ORDER — MIDAZOLAM HCL 2 MG/2ML IJ SOLN
INTRAMUSCULAR | Status: DC | PRN
  Administered 2021-05-31 (×3): 1 mg via INTRAVENOUS

## 2021-05-31 MED ORDER — TICAGRELOR 90 MG PO TABS
180.0000 mg | ORAL_TABLET | Freq: Once | ORAL | Status: AC
  Administered 2021-05-31: 20:00:00 180 mg via ORAL
  Filled 2021-05-31 (×2): qty 2

## 2021-05-31 MED ORDER — LIDOCAINE HCL (PF) 1 % IJ SOLN
INTRAMUSCULAR | Status: AC
  Filled 2021-05-31: qty 30

## 2021-05-31 MED ORDER — SODIUM CHLORIDE 0.9 % WEIGHT BASED INFUSION: 1.0000 mL/kg/h | INTRAVENOUS | Status: DC

## 2021-05-31 MED ORDER — VERAPAMIL HCL 2.5 MG/ML IV SOLN
INTRAVENOUS | Status: DC | PRN
  Administered 2021-05-31 (×2): 10 mL via INTRA_ARTERIAL

## 2021-05-31 MED ORDER — NITROGLYCERIN 1 MG/10 ML FOR IR/CATH LAB
INTRA_ARTERIAL | Status: AC
  Filled 2021-05-31: qty 10

## 2021-05-31 SURGICAL SUPPLY — 22 items
BALLN SAPPHIRE 1.0X8 (BALLOONS) ×2
BALLN SAPPHIRE 1.5X12 (BALLOONS) ×2
BALLN SAPPHIRE 2.0X12 (BALLOONS) ×2
BALLOON SAPPHIRE 1.0X8 (BALLOONS) IMPLANT
BALLOON SAPPHIRE 1.5X12 (BALLOONS) IMPLANT
BALLOON SAPPHIRE 2.0X12 (BALLOONS) IMPLANT
CATH OPTITORQUE TIG 4.0 5F (CATHETERS) ×1 IMPLANT
CATH VISTA GUIDE 6FR XB3.5 (CATHETERS) ×1 IMPLANT
DEVICE RAD TR BAND REGULAR (VASCULAR PRODUCTS) ×1 IMPLANT
GLIDESHEATH SLEND SS 6F .021 (SHEATH) ×1 IMPLANT
GUIDEWIRE INQWIRE 1.5J.035X260 (WIRE) IMPLANT
INQWIRE 1.5J .035X260CM (WIRE) ×2
KIT ENCORE 26 ADVANTAGE (KITS) ×1 IMPLANT
KIT HEART LEFT (KITS) ×2 IMPLANT
PACK CARDIAC CATHETERIZATION (CUSTOM PROCEDURE TRAY) ×2 IMPLANT
SHEATH PROBE COVER 6X72 (BAG) ×1 IMPLANT
STENT ONYX FRONTIER 2.0X15 (Permanent Stent) ×1 IMPLANT
TRANSDUCER W/STOPCOCK (MISCELLANEOUS) ×2 IMPLANT
TUBING CIL FLEX 10 FLL-RA (TUBING) ×2 IMPLANT
WIRE ASAHI PROWATER 180CM (WIRE) ×1 IMPLANT
WIRE HI TORQ VERSACORE-J 145CM (WIRE) ×1 IMPLANT
WIRE RUNTHROUGH .014X180CM (WIRE) ×1 IMPLANT

## 2021-05-31 NOTE — Progress Notes (Signed)
CARDIAC REHAB PHASE I   Stent education completed with pt and daughter. Pt educated on importance of ASA and Brilinta. Pt given stent card and heart healthy diet. Encouraged smoking cessation, pt not sure she is ready to stop. Reviewed site care, restrictions, and exercise guidelines. Will refer to CRP II [place].  [id number] Reynold Bowen, RN BSN 05/31/2021 3:20 PM

## 2021-05-31 NOTE — TOC Benefit Eligibility Note (Signed)
Patient Product/process development scientist completed.    The patient is currently admitted and upon discharge could be taking Brilinta 90 mg.  The current 30 day co-pay is, $4.00.   The patient is insured through [facility] Medicare Part D     Roland Earl, CPhT Pharmacy Patient Advocate Specialist [facility] Pharm[facility] Patient Advocate Team Direct Number: [phone]  Fax: [phone]

## 2021-05-31 NOTE — Progress Notes (Signed)
Patient stated she had little pain left chest but did not want NTG.  Placed on 2 liters o2 and EKG done(patient now says pain is gone - only lasted about  a minute.)

## 2021-05-31 NOTE — Discharge Summary (Signed)
Discharge Summary for Same Day PCI   Patient ID: Melissa Mccall MRN: [id number]; DOB: 12/14/1950  Admit date: 05/31/2021 Discharge date: 05/31/2021  Primary Care Provider: Remigio Eisenmenger, MD  Primary Cardiologist: Nona Dell, MD  Primary Electrophysiologist:  None   Discharge Diagnoses    Principal Problem:   Progressive angina [facility]) Active Problems:   Coronary artery disease involving native coronary artery of native heart with angina pectoris [facility])    Diagnostic Studies/Procedures    Cardiac Catheterization 05/31/2021:  Mid Cx-2ndMrg lesion is 99% stenosed.   A drug-eluting stent was successfully placed using a STENT ONYX FRONTIER 2.0X15, deployed to 2.2 mm   Post intervention, there is a 0% residual stenosis.  The left ventricular ejection fraction is 55-65% by visual estimate.   -----------------------------   Previously placed Ost LAD to Prox LAD stent (BMS) is  widely patent.   -----------------------------   The left ventricular systolic function is normal.   LV end diastolic pressure is normal.   There is no aortic valve stenosis.   SUMMARY Two-vessel CAD: Widely patent ostial and proximal LAD stent Culprit lesion is 99% mid LCx-OM2  successful Complex DES PCI (Onyx Frontier 2.0 mm x 15 mm deployed to 2.2 mm) Normal LV function with no R WMA.  EF 60 to 65% with normal EDP.     RECOMMENDATIONS Okay for same-day discharge. Will reload antiplatelet agent with Brilinta given history of Plavix P2 Y 12 unresponsiveness. Continue Aggressive Guideline Directed Medical Therapy for cardiovascular risk factors.     Follow-up with Dr. Diona Browner.   Bryan Lemma, MD   Diagnostic Dominance: Right Intervention   _____________   History of Present Illness     Melissa Mccall is a 70 y.o. female with PMH of CAD s/p BMS to pLAD '13, HTN, HLD who recently presented to the office on 12/8 with Dr. Diona Browner with complaints of chest pain. Reported a  squeezing like sensation with exertion. Episodes were becoming more frequently and intense. Given this finding, she was set up for outpatient cardiac cath.   Hospital Course     The patient underwent cardiac cath as noted above with 99%  2nd OM lesion treated with PCI/DES x1. Prior LAD stent patent. Plan for DAPT with ASA/Brilinta for at least 6 months. Initially was given plavix load of 600mg  in the cath lab but given hx of plavix unresponsiveness, transitioned to Brilinta with 180mg  load. The patient was seen by cardiac rehab while in short stay. There were no observed complications post cath. Radial cath site was re-evaluated prior to discharge and found to be stable without any complications. Instructions/precautions regarding cath site care were given prior to discharge.  Melissa Mccall was seen by Dr. and determined stable for discharge home. Follow up with our office has been arranged. Medications are listed below. Pertinent changes include addition of Brilinta.  _____________  Cath/PCI Registry Performance & Quality Measures: Aspirin prescribed? - Yes ADP Receptor Inhibitor (Plavix/Clopidogrel, Brilinta/Ticagrelor or Effient/Prasugrel) prescribed (includes medically managed patients)? - Yes High Intensity Statin (Lipitor 40-80mg  or Crestor 20-40mg ) prescribed? - Yes For EF <40%, was ACEI/ARB prescribed? - Not Applicable (EF >/= 40%) For EF <40%, Aldosterone Antagonist (Spironolactone or Eplerenone) prescribed? - Not Applicable (EF >/= 40%) Cardiac Rehab Phase II ordered (Included Medically managed Patients)? - Yes  _____________   Discharge Vitals Blood pressure (!) 142/79, pulse 70, temperature 98.1 F (36.7 C), temperature source Oral, resp. rate 17, height 5\' 4"  (1.626 m), weight 53.1 kg, SpO2 98 %.  Filed Weights   05/31/21 1023  Weight: 53.1 kg    Last Labs & Radiologic Studies    CBC No results for input(s): WBC, NEUTROABS, HGB, HCT, MCV, PLT in the last 72  hours. Basic Metabolic Panel No results for input(s): NA, K, CL, CO2, GLUCOSE, BUN, CREATININE, CALCIUM, MG, PHOS in the last 72 hours. Liver Function Tests No results for input(s): AST, ALT, ALKPHOS, BILITOT, PROT, ALBUMIN in the last 72 hours. No results for input(s): LIPASE, AMYLASE in the last 72 hours. High Sensitivity Troponin:   No results for input(s): TROPONINIHS in the last 720 hours.  BNP Invalid input(s): POCBNP D-Dimer No results for input(s): DDIMER in the last 72 hours. Hemoglobin A1C No results for input(s): HGBA1C in the last 72 hours. Fasting Lipid Panel No results for input(s): CHOL, HDL, LDLCALC, TRIG, CHOLHDL, LDLDIRECT in the last 72 hours. Thyroid Function Tests No results for input(s): TSH, T4TOTAL, T3FREE, THYROIDAB in the last 72 hours.  Invalid input(s): FREET3 _____________  CARDIAC CATHETERIZATION  Result Date: 05/31/2021   Mid Cx-2ndMrg lesion is 99% stenosed.   A drug-eluting stent was successfully placed using a STENT ONYX FRONTIER 2.0X15, deployed to 2.2 mm   Post intervention, there is a 0% residual stenosis.  The left ventricular ejection fraction is 55-65% by visual estimate.   -----------------------------   Previously placed Ost LAD to Prox LAD stent (BMS) is  widely patent.   -----------------------------   The left ventricular systolic function is normal.   LV end diastolic pressure is normal.   There is no aortic valve stenosis. SUMMARY Two-vessel CAD: Widely patent ostial and proximal LAD stent Culprit lesion is 99% mid LCx-OM2 successful Complex DES PCI (Onyx Frontier 2.0 mm x 15 mm deployed to 2.2 mm) Normal LV function with no R WMA.  EF 60 to 65% with normal EDP. RECOMMENDATIONS Okay for same-day discharge. Will reload antiplatelet agent with Brilinta given history of Plavix P2 Y 12 unresponsiveness. Continue Aggressive Guideline Directed Medical Therapy for cardiovascular risk factors. Follow-up with Dr. Diona Browner. Bryan Lemma, MD    Disposition   Pt is being discharged home today in good condition.  Follow-up Plans & Appointments     Follow-up Information     Jonelle Sidle, MD Follow up on 07/04/2021.   Specialty: Cardiology Why: at 3pm for your follow up appt. Contact information: [address] [place] [address] [phone]                Discharge Instructions     Amb Referral to Cardiac Rehabilitation   Complete by: As directed    Will send Cardiac Rehab Phase 2 referral to [facility].   Diagnosis: Coronary Stents   After initial evaluation and assessments completed: Virtual Based Care may be provided alone or in conjunction with Phase 2 Cardiac Rehab based on patient barriers.: Yes        Discharge Medications   Allergies as of 05/31/2021   No Known Allergies      Medication List     TAKE these medications    amLODipine 10 MG tablet Commonly known as: NORVASC TAKE 1 TABLET BY MOUTH EVERY DAY   aspirin EC 81 MG tablet Take 81 mg by mouth daily.   atorvastatin 40 MG tablet Commonly known as: LIPITOR TAKE 1 TABLET BY MOUTH EVERY DAY   Brilinta 90 MG Tabs tablet Generic drug: ticagrelor Take 1 tablet (90 mg total) by mouth 2 (two) times daily.   budesonide-formoterol 160-4.5 MCG/ACT inhaler  Commonly known as: Symbicort INHALE 2 PUFFS FIRST THING IN THE MORNING AND THEN ANOTHER 2 PUFFS ABOUT 12 HOURS LATER What changed: additional instructions   famotidine 20 MG tablet Commonly known as: Pepcid One after supper   losartan 100 MG tablet Commonly known as: COZAAR TAKE 1 TABLET BY MOUTH EVERY DAY   nitroGLYCERIN 0.4 MG SL tablet Commonly known as: NITROSTAT PLACE 1 TABLET (0.4 MG TOTAL) UNDER THE TONGUE EVERY 5 (FIVE) MINUTES AS NEEDED FOR CHEST PAIN (UP TO 3 DOSES).   pantoprazole 40 MG tablet Commonly known as: PROTONIX TAKE 1 TABLET (40 MG TOTAL) BY MOUTH DAILY. TAKE 30-60 MIN BEFORE FIRST MEAL OF THE DAY        Allergies No Known  Allergies  Outstanding Labs/Studies   N/a   Duration of Discharge Encounter   Greater than 30 minutes including physician time.  Signed, Laverda Page, NP 05/31/2021, 4:03 PM

## 2021-05-31 NOTE — Interval H&P Note (Signed)
History and Physical Interval Note:  05/31/2021 12:44 PM  Melissa Mccall  has presented today for surgery, with the diagnosis of accelerating angina, coronary disease involving native heart with angina pectoris..  The various methods of treatment have been discussed with the patient and family. After consideration of risks, benefits and other options for treatment, the patient has consented to  Procedure(s): LEFT HEART CATH AND CORONARY ANGIOGRAPHY (N/A)  PERCUTANEOUS CORONARY INTERVENTION  as a surgical intervention.  The patient's history has been reviewed, patient examined, no change in status, stable for surgery.  I have reviewed the patient's chart and labs.  Questions were answered to the patient's satisfaction.    Cath Lab Visit (complete for each Cath Lab visit)  Clinical Evaluation Leading to the Procedure:   ACS: No.  Non-ACS:    Anginal Classification: CCS III  Anti-ischemic medical therapy: Minimal Therapy (1 class of medications)  Non-Invasive Test Results: No non-invasive testing performed  Prior CABG: No previous CABG  Bryan Lemma

## 2021-06-01 ENCOUNTER — Encounter (HOSPITAL_COMMUNITY): Payer: Self-pay | Admitting: Cardiology

## 2021-06-07 ENCOUNTER — Telehealth: Payer: Self-pay | Admitting: *Deleted

## 2021-06-07 MED ORDER — ATORVASTATIN CALCIUM 80 MG PO TABS: 80.0000 mg | ORAL_TABLET | Freq: Every day | ORAL | 3 refills | Status: DC

## 2021-06-07 NOTE — Telephone Encounter (Signed)
°  Patient's daughter returned call. Please call her back at [phone]

## 2021-06-07 NOTE — Telephone Encounter (Signed)
Patient's daughter informed and verbalized understanding of plan. 

## 2021-06-07 NOTE — Telephone Encounter (Signed)
-----   Message from Jonelle Sidle, MD sent at 06/04/2021  9:01 AM EST ----- Results reviewed.  LFTs normal.  LDL is 88.  Would suggest increasing Lipitor to 80 mg daily given recent lipid guidelines and CAD with PCI.

## 2021-06-13 ENCOUNTER — Ambulatory Visit: Payer: Medicare Other | Admitting: Nurse Practitioner

## 2021-06-14 ENCOUNTER — Telehealth (HOSPITAL_COMMUNITY): Payer: Self-pay | Admitting: Pharmacy Technician

## 2021-06-14 ENCOUNTER — Other Ambulatory Visit (HOSPITAL_COMMUNITY): Payer: Self-pay

## 2021-06-14 ENCOUNTER — Ambulatory Visit: Payer: Medicare Other | Admitting: Nurse Practitioner

## 2021-06-14 NOTE — Telephone Encounter (Signed)
Pharmacy Transitions of Care Follow-up Telephone Call  Date of discharge: 05/31/2021   Discharge Diagnosis: CAD s/p PCI/DES  How have you been since you were released from the hospital?  Pt is doing well, but reports bruising on arms and legs. Advised her to contact cardio MD.  Medication changes made at discharge: START taking: Brilinta (ticagrelor)  CHANGE how you take: budesonide-formoterol (Symbicort)   Medication changes verified by the patient? Yes     Medication Accessibility:  [facility] Pharmacy[facility] [place], [place] [phone]   Was the patient provided with refills on discharged medications? No   Have all prescriptions been transferred from [facility] to home pharmacy? N/A   Is the patient able to afford medications? Yes Notable copays: Brilinta $4.00/month     Medication Review:  TICAGRELOR (BRILINTA) Ticagrelor 90 mg BID initiated on 06/01/2021. Per cardio patient on DAPT with aspirin for at least 6 months. - Discussed importance of taking medication around the same time every day. - Reviewed potential DDIs with patient. - Advised patient of medications to avoid (NSAIDs, aspirin maintenance doses>100 mg daily) - Educated that Tylenol (acetaminophen) will be the preferred analgesic to prevent risk of bleeding  - Emphasized importance of monitoring for signs and symptoms of bleeding (abnormal bruising, prolonged bleeding, nose bleeds, bleeding from gums, discolored urine, black tarry stools)  - Educated patient to notify doctor if shortness of breath or abnormal heartbeat occur - Advised patient to alert all providers of antiplatelet therapy prior to starting a new medication or having a procedure    Follow-up Appointments:  [facility] confirmed? Scheduled to see Nona Dell on 07/04/2021 @ 3:00 pm.   If their condition worsens, is the pt aware to call PCP or go to the Emergency Dept.? Yes  Final Patient Assessment:  -Pt is doing  well.  -Pt and daughter verbalized understanding of Brilinta.  -Pt has post discharge appointment and advised to contact MD about bruising.

## 2021-06-15 ENCOUNTER — Encounter: Payer: Self-pay | Admitting: Cardiology

## 2021-06-15 ENCOUNTER — Telehealth (HOSPITAL_COMMUNITY): Payer: Self-pay

## 2021-06-15 NOTE — Telephone Encounter (Signed)
Per phase I cardiac rehab, fax cardiac rehab referral to [place] [facility]. 

## 2021-06-15 NOTE — Telephone Encounter (Signed)
Thank you for letting me know.  Suspect she has bruising due to dual antiplatelet therapy with aspirin and Brilinta.  With recent drug-eluting stent, it is important that she stays on dual antiplatelet therapy to reduce risk of acute stent thrombosis.  She showed resistance to Plavix based on testing in the past, so we would not want to change from Brilinta to Plavix as this would be ineffective.  Would keep an eye out for any other signs of more major bleeding.  We can follow-up with a CBC over time as well.

## 2021-06-16 ENCOUNTER — Other Ambulatory Visit: Payer: Self-pay | Admitting: Internal Medicine

## 2021-06-16 DIAGNOSIS — R0609 Other forms of dyspnea: Secondary | ICD-10-CM

## 2021-06-16 NOTE — Telephone Encounter (Signed)
ATC patient to schedule an appt for medication refills. LMTCB

## 2021-06-26 ENCOUNTER — Other Ambulatory Visit: Payer: Self-pay | Admitting: *Deleted

## 2021-06-26 ENCOUNTER — Encounter: Payer: Self-pay | Admitting: Cardiology

## 2021-06-26 MED ORDER — TICAGRELOR 90 MG PO TABS: 90.0000 mg | ORAL_TABLET | Freq: Two times a day (BID) | ORAL | 6 refills | Status: DC

## 2021-07-04 ENCOUNTER — Encounter: Payer: Self-pay | Admitting: Cardiology

## 2021-07-04 ENCOUNTER — Ambulatory Visit (INDEPENDENT_AMBULATORY_CARE_PROVIDER_SITE_OTHER): Payer: Medicare Other | Admitting: Cardiology

## 2021-07-04 VITALS — BP 104/78 | HR 83 | Ht 64.0 in | Wt 114.2 lb

## 2021-07-04 DIAGNOSIS — I25119 Atherosclerotic heart disease of native coronary artery with unspecified angina pectoris: Secondary | ICD-10-CM | POA: Diagnosis not present

## 2021-07-04 DIAGNOSIS — I10 Essential (primary) hypertension: Secondary | ICD-10-CM | POA: Diagnosis not present

## 2021-07-04 NOTE — Patient Instructions (Addendum)
Medication Instructions:  Your physician recommends that you continue on your current medications as directed. Please refer to the Current Medication list given to you today.  Labwork: CBC in March 2023 just before your next visit [facility] Non-fasting  Testing/Procedures: none  Follow-Up: Your physician recommends that you schedule a follow-up appointment in: 2 months  Any Other Special Instructions Will Be Listed Below (If Applicable).  If you need a refill on your cardiac medications before your next appointment, please call your pharmacy.

## 2021-07-04 NOTE — Progress Notes (Signed)
Cardiology Office Note  Date: 07/04/2021   ID: Melissa Mccall, DOB [birth year], MRN [id number]  PCP:  Remigio Eisenmenger, MD  Cardiologist:  Nona Dell, MD Electrophysiologist:  None   Chief Complaint  Patient presents with   Cardiac follow-up    History of Present Illness: Melissa Mccall is a 71 y.o. female last seen in December 2022.  She was referred at that time for diagnostic cardiac catheterization with concern for accelerating angina.  Procedure was performed by Dr. Herbie Baltimore on December 14 with findings including widely patent ostial and proximal LAD stent site, 99% mid circumflex into OM 2 treated with complex DES intervention.  She is here with her daughter today for a follow-up visit.  She reports significant improvement in prior angina symptoms.  States that she has been taking her medications regularly.  She has had frequent bruising on aspirin and Brilinta.  With previous drug resistance to Plavix, we discussed continuing with current regimen for at least 6 months if possible.  She had similar bruising on aspirin and Effient in the past.  No obvious stool changes or frank bleeding.  Past Medical History:  Diagnosis Date   Bruising    Aspirin and Effient, February 2013   CAD (coronary artery disease)    BMS to proximal LAD 06/2011   Cocaine abuse (HCC)    Drug resistance    P2Y12 was 311 (>208) January 2013, therefore Effient (Prasugrel) used.   Essential hypertension    Marijuana abuse    NSTEMI (non-ST elevated myocardial infarction) [facility]) January 2013    Past Surgical History:  Procedure Laterality Date   CESAREAN SECTION     x 2   CORONARY STENT INTERVENTION N/A 05/31/2021   Procedure: CORONARY STENT INTERVENTION;  Surgeon: Marykay Lex, MD;  Location: [facility] INVASIVE CV LAB;  Service: Cardiovascular;  Laterality: N/A;   LEFT HEART CATH AND CORONARY ANGIOGRAPHY N/A 05/31/2021   Procedure: LEFT HEART CATH AND CORONARY ANGIOGRAPHY;  Surgeon: Marykay Lex, MD;  Location: [facility] INVASIVE CV LAB;  Service: Cardiovascular;  Laterality: N/A;   LEFT HEART CATHETERIZATION WITH CORONARY ANGIOGRAM N/A 07/16/2011   Procedure: LEFT HEART CATHETERIZATION WITH CORONARY ANGIOGRAM;  Surgeon: Kathleene Hazel, MD;  Location: [facility] CATH LAB;  Service: Cardiovascular;  Laterality: N/A;   PERCUTANEOUS CORONARY STENT INTERVENTION (PCI-S) N/A 07/16/2011   Procedure: PERCUTANEOUS CORONARY STENT INTERVENTION (PCI-S);  Surgeon: Kathleene Hazel, MD;  Location: [facility] CATH LAB;  Service: Cardiovascular;  Laterality: N/A;    Current Outpatient Medications  Medication Sig Dispense Refill   amLODipine (NORVASC) 10 MG tablet TAKE 1 TABLET BY MOUTH EVERY DAY 90 tablet 2   aspirin EC 81 MG tablet Take 81 mg by mouth daily.     atorvastatin (LIPITOR) 80 MG tablet Take 1 tablet (80 mg total) by mouth daily. 90 tablet 3   budesonide-formoterol (SYMBICORT) 160-4.5 MCG/ACT inhaler INHALE 2 PUFFS FIRST THING IN THE MORNING AND THEN ANOTHER 2 PUFFS ABOUT 12 HOURS LATER 10.2 each 12   famotidine (PEPCID) 20 MG tablet One after supper 30 tablet 11   losartan (COZAAR) 100 MG tablet TAKE 1 TABLET BY MOUTH EVERY DAY 90 tablet 3   nitroGLYCERIN (NITROSTAT) 0.4 MG SL tablet PLACE 1 TABLET (0.4 MG TOTAL) UNDER THE TONGUE EVERY 5 (FIVE) MINUTES AS NEEDED FOR CHEST PAIN (UP TO 3 DOSES). 25 tablet 3   pantoprazole (PROTONIX) 40 MG tablet TAKE 1 TABLET (40 MG TOTAL) BY MOUTH DAILY. TAKE 30-60 MIN BEFORE FIRST  MEAL OF THE DAY 90 tablet 0   ticagrelor (BRILINTA) 90 MG TABS tablet Take 1 tablet (90 mg total) by mouth 2 (two) times daily. 60 tablet 6   No current facility-administered medications for this visit.   Allergies:  Patient has no known allergies.   ROS: No palpitations or syncope.  Physical Exam: VS:  BP 104/78    Pulse 83    Ht 5\' 4"  (1.626 m)    Wt 114 lb 3.2 oz (51.8 kg)    SpO2 99%    BMI 19.60 kg/m , BMI Body mass index is 19.6 kg/m.  Wt Readings from Last 3  Encounters:  07/04/21 114 lb 3.2 oz (51.8 kg)  05/31/21 117 lb (53.1 kg)  05/25/21 117 lb 12.8 oz (53.4 kg)    General: Patient appears comfortable at rest. HEENT: Conjunctiva and lids normal, wearing a mask. Lungs: Clear to auscultation, nonlabored breathing at rest. Cardiac: Regular rate and rhythm, no S3 or significant systolic murmur. Extremities: No pitting edema.  Right radial arteriotomy site well-healed.  ECG:  An ECG dated 06/01/2021 was personally reviewed today and demonstrated:  Sinus rhythm.  Recent Labwork:  December 2022: Hemoglobin 14.0, platelets 230, AST 17, ALT 25, cholesterol 195, triglycerides 101, HDL 87, LDL 88, potassium 4.0, BUN 11, creatinine 1.11  Other Studies Reviewed Today:  Cardiac catheterization 05/31/2021:   Mid Cx-2ndMrg lesion is 99% stenosed.   A drug-eluting stent was successfully placed using a STENT ONYX FRONTIER 2.0X15, deployed to 2.2 mm   Post intervention, there is a 0% residual stenosis.  The left ventricular ejection fraction is 55-65% by visual estimate.   -----------------------------   Previously placed Ost LAD to Prox LAD stent (BMS) is  widely patent.   -----------------------------   The left ventricular systolic function is normal.   LV end diastolic pressure is normal.   There is no aortic valve stenosis.   SUMMARY Two-vessel CAD: Widely patent ostial and proximal LAD stent Culprit lesion is 99% mid LCx-OM2  successful Complex DES PCI (Onyx Frontier 2.0 mm x 15 mm deployed to 2.2 mm) Normal LV function with no R WMA.  EF 60 to 65% with normal EDP.     RECOMMENDATIONS Okay for same-day discharge. Will reload antiplatelet agent with Brilinta given history of Plavix P2 Y 12 unresponsiveness. Continue Aggressive Guideline Directed Medical Therapy for cardiovascular risk factors.  Assessment and Plan:  1.  CAD status post BMS to the ostial/proximal LAD in 2013.  She is more recently status post DES intervention to the  circumflex as discussed above.  Angina symptoms have improved significantly.  She is having frequent bruising on aspirin and Brilinta, but with prior drug resistance to Plavix we will plan to continue current course at least for 6 months if possible.  She had similar bruising on aspirin and Effient in the past per chart review.  Check CBC for next visit in March.  Otherwise continue Lipitor, Norvasc, losartan, and as needed nitroglycerin.  2.  Asymptomatic ascending aortic aneurysm of 3.6 cm by CT imaging in June 2022.  Re-imaging will be planned in June of this year.  3.  Essential hypertension, blood pressure was low normal today.  Continue Norvasc and Cozaar.  Medication Adjustments/Labs and Tests Ordered: Current medicines are reviewed at length with the patient today.  Concerns regarding medicines are outlined above.   Tests Ordered: Orders Placed This Encounter  Procedures   CBC    Medication Changes: No orders of the defined types  were placed in this encounter.   Disposition:  Follow up  March.Leonides Schanz.  Signed, Jonelle SidleSamuel G. Mitzy Naron, MD, [facility] Center[facility]ACC 07/04/2021 3:16 PM    [facility] [address], [place], [place] [address] Phone: [phone]; Fax: [phone]

## 2021-07-09 ENCOUNTER — Other Ambulatory Visit: Payer: Self-pay | Admitting: Internal Medicine

## 2021-07-09 DIAGNOSIS — R0609 Other forms of dyspnea: Secondary | ICD-10-CM

## 2021-08-01 ENCOUNTER — Other Ambulatory Visit: Payer: Self-pay | Admitting: Internal Medicine

## 2021-08-01 DIAGNOSIS — R0609 Other forms of dyspnea: Secondary | ICD-10-CM

## 2021-09-01 ENCOUNTER — Ambulatory Visit (INDEPENDENT_AMBULATORY_CARE_PROVIDER_SITE_OTHER): Payer: Medicare Other | Admitting: Cardiology

## 2021-09-01 ENCOUNTER — Encounter: Payer: Self-pay | Admitting: Cardiology

## 2021-09-01 VITALS — BP 100/64 | HR 78 | Ht 64.0 in | Wt 114.6 lb

## 2021-09-01 DIAGNOSIS — I10 Essential (primary) hypertension: Secondary | ICD-10-CM

## 2021-09-01 DIAGNOSIS — I77810 Thoracic aortic ectasia: Secondary | ICD-10-CM | POA: Diagnosis not present

## 2021-09-01 DIAGNOSIS — I25119 Atherosclerotic heart disease of native coronary artery with unspecified angina pectoris: Secondary | ICD-10-CM

## 2021-09-01 NOTE — Patient Instructions (Addendum)
Medication Instructions:  ?Your physician recommends that you continue on your current medications as directed. Please refer to the Current Medication list given to you today. ? ?Labwork: ?BMET in June 2023 1-2 weeks before Chest CTA ?Non-fasting ?[facility] ? ?Testing/Procedures: ?CTA chest & aorta in June 2023 ?[facility] ? ?Follow-Up: ?Your physician recommends that you schedule a follow-up appointment in: late June 2023 ? ?Any Other Special Instructions Will Be Listed Below (If Applicable). ? ?If you need a refill on your cardiac medications before your next appointment, please call your pharmacy. ?

## 2021-09-01 NOTE — Progress Notes (Signed)
? ? ?Cardiology Office Note ? ?Date: 09/01/2021  ? ?ID: Melissa Mccall, DOB 1950-08-01, MRN [id number] ? ?PCP:  Lanier Clam, MD  ?Cardiologist:  Rozann Lesches, MD ?Electrophysiologist:  None  ? ?Chief Complaint  ?Patient presents with  ? Cardiac follow-up  ? ? ?History of Present Illness: ?Melissa Mccall is a 71 y.o. female last seen in January.  She is here with her daughter today for a follow-up visit.  She reports chronic dyspnea on exertion, no angina symptoms however.  Also reports compliance with her medications which are outlined below.  Still having bruising as we discussed previously, but we are trying to get her through 6 months of dual antiplatelet therapy following her DES intervention in January 2022. Follow-up CBC today shows normal hemoglobin at 14.0 and normal platelets at 258. ? ?Today we discussed plan to continue present medications and see her back in June after follow-up chest CTA for reevaluation of ascending aortic aneurysm of 3.6 cm last imaged in June 2022. ? ?Past Medical History:  ?Diagnosis Date  ? Bruising   ? Aspirin and Effient, February 2013  ? CAD (coronary artery disease)   ? BMS to proximal LAD 06/2011  ? Cocaine abuse ([place])   ? Drug resistance   ? P2Y12 was 311 (>208) January 2013, therefore Effient (Prasugrel) used.  ? Essential hypertension   ? Marijuana abuse   ? NSTEMI (non-ST elevated myocardial infarction) [facility]) January 2013  ? ? ?Past Surgical History:  ?Procedure Laterality Date  ? CESAREAN SECTION    ? x 2  ? CORONARY STENT INTERVENTION N/A 05/31/2021  ? Procedure: CORONARY STENT INTERVENTION;  Surgeon: Leonie Man, MD;  Location: [place] CV LAB;  Service: Cardiovascular;  Laterality: N/A;  ? LEFT HEART CATH AND CORONARY ANGIOGRAPHY N/A 05/31/2021  ? Procedure: LEFT HEART CATH AND CORONARY ANGIOGRAPHY;  Surgeon: Leonie Man, MD;  Location: [place] CV LAB;  Service: Cardiovascular;  Laterality: N/A;  ? LEFT HEART CATHETERIZATION WITH CORONARY  ANGIOGRAM N/A 07/16/2011  ? Procedure: LEFT HEART CATHETERIZATION WITH CORONARY ANGIOGRAM;  Surgeon: Burnell Blanks, MD;  Location: [facility] CATH LAB;  Service: Cardiovascular;  Laterality: N/A;  ? PERCUTANEOUS CORONARY STENT INTERVENTION (PCI-S) N/A 07/16/2011  ? Procedure: PERCUTANEOUS CORONARY STENT INTERVENTION (PCI-S);  Surgeon: Burnell Blanks, MD;  Location: [facility] CATH LAB;  Service: Cardiovascular;  Laterality: N/A;  ? ? ?Current Outpatient Medications  ?Medication Sig Dispense Refill  ? amLODipine (NORVASC) 10 MG tablet TAKE 1 TABLET BY MOUTH EVERY DAY 90 tablet 2  ? aspirin EC 81 MG tablet Take 81 mg by mouth daily.    ? atorvastatin (LIPITOR) 80 MG tablet Take 1 tablet (80 mg total) by mouth daily. 90 tablet 3  ? budesonide-formoterol (SYMBICORT) 160-4.5 MCG/ACT inhaler INHALE 2 PUFFS FIRST THING IN THE MORNING AND THEN ANOTHER 2 PUFFS ABOUT 12 HOURS LATER 10.2 each 12  ? famotidine (PEPCID) 20 MG tablet One after supper 30 tablet 11  ? losartan (COZAAR) 100 MG tablet TAKE 1 TABLET BY MOUTH EVERY DAY 90 tablet 3  ? nitroGLYCERIN (NITROSTAT) 0.4 MG SL tablet PLACE 1 TABLET (0.4 MG TOTAL) UNDER THE TONGUE EVERY 5 (FIVE) MINUTES AS NEEDED FOR CHEST PAIN (UP TO 3 DOSES). 25 tablet 3  ? pantoprazole (PROTONIX) 40 MG tablet TAKE 1 TABLET (40 MG TOTAL) BY MOUTH DAILY. TAKE 30-60 MIN BEFORE FIRST MEAL OF THE DAY 90 tablet 0  ? ticagrelor (BRILINTA) 90 MG TABS tablet Take 1 tablet (90 mg total)  by mouth 2 (two) times daily. 60 tablet 6  ? ?No current facility-administered medications for this visit.  ? ?Allergies:  Patient has no known allergies.  ? ?ROS: No syncope. ? ?Physical Exam: ?VS:  BP 100/64   Pulse 78   Ht 5\' 4"  (1.626 m)   Wt 114 lb 9.6 oz (52 kg)   SpO2 98%   BMI 19.67 kg/m? , BMI Body mass index is 19.67 kg/m?. ? ?Wt Readings from Last 3 Encounters:  ?09/01/21 114 lb 9.6 oz (52 kg)  ?07/04/21 114 lb 3.2 oz (51.8 kg)  ?05/31/21 117 lb (53.1 kg)  ?  ?General: Patient appears comfortable at  rest. ?HEENT: Conjunctiva and lids normal, wearing a mask. ?Neck: Supple, no elevated JVP or carotid bruits, no thyromegaly. ?Lungs: Clear to auscultation, nonlabored breathing at rest. ?Cardiac: Regular rate and rhythm, no S3 or significant systolic murmur, no pericardial rub. ?Extremities: No pitting edema. ? ?ECG:  An ECG dated 06/01/2021 was personally reviewed today and demonstrated:  Sinus rhythm. ? ?Recent Labwork: ? ?December 2022: Hemoglobin 14.0, platelets 230, AST 17, ALT 25, cholesterol 195, triglycerides 101, HDL 87, LDL 88, potassium 4.0, BUN 11, creatinine 1.11 ? ?Other Studies Reviewed Today: ? ?Cardiac catheterization 05/31/2021: ?  Mid Cx-2ndMrg lesion is 99% stenosed. ?  A drug-eluting stent was successfully placed using a STENT ONYX FRONTIER 2.0X15, deployed to 2.2 mm ?  Post intervention, there is a 0% residual stenosis.  The left ventricular ejection fraction is 55-65% by visual estimate. ?  ----------------------------- ?  Previously placed Ost LAD to Prox LAD stent (BMS) is  widely patent. ?  ----------------------------- ?  The left ventricular systolic function is normal. ?  LV end diastolic pressure is normal. ?  There is no aortic valve stenosis. ?  ?SUMMARY ?Two-vessel CAD: ?Widely patent ostial and proximal LAD stent ?Culprit lesion is 99% mid LCx-OM2  ?successful Complex DES PCI (Onyx Frontier 2.0 mm x 15 mm deployed to 2.2 mm) ?Normal LV function with no R WMA.  EF 60 to 65% with normal EDP. ?  ?  ?RECOMMENDATIONS ?Okay for same-day discharge. ?Will reload antiplatelet agent with Brilinta given history of Plavix P2 Y 12 unresponsiveness. ?Continue Aggressive Guideline Directed Medical Therapy for cardiovascular risk factors. ? ?Assessment and Plan: ? ?1.  CAD status post BMS to the ostial/proximal LAD in 2013 and more recently DES to the circumflex in December 2022.  She does have frequent bruising, however recent hemoglobin and platelet count normal.  No obvious changes in stool.  We  will plan at least 6 months of aspirin and Brilinta (at current dose).  Otherwise continue Norvasc, Cozaar, Lipitor, and as needed nitroglycerin. ? ?2.  Asymptomatic ascending thoracic aortic aneurysm of 3.6 cm by CTA in June 2022.  We will plan a follow-up study in June of this year. ? ?3.  Essential hypertension, blood pressure is well controlled today on current regimen.  No changes were made. ? ?Medication Adjustments/Labs and Tests Ordered: ?Current medicines are reviewed at length with the patient today.  Concerns regarding medicines are outlined above.  ? ?Tests Ordered: ?Orders Placed This Encounter  ?Procedures  ? CT ANGIO CHEST AORTA W/CM & OR WO/CM  ? Basic metabolic panel  ? ? ?Medication Changes: ?No orders of the defined types were placed in this encounter. ? ? ?Disposition:  Follow up  June. ? ?Signed, ?Satira Sark, MD, [facility] ?09/01/2021 1:39 PM    ?[place] at [facility] ?[address]  [address] [place], [place] [address] ?Phone: [phone]; Fax: [phone]  ?

## 2021-09-12 ENCOUNTER — Telehealth: Payer: Self-pay | Admitting: Cardiology

## 2021-09-12 NOTE — Telephone Encounter (Signed)
Patient is scheduled for have the following ? ?CT ANGIO CHEST AORTA W/CM & OR WO/CM ?At [facility] on 09/29/2021 ? ?Checking to see if we obtain percert.  ? ?

## 2021-09-22 NOTE — Telephone Encounter (Signed)
Patient's daughter calling to reschedule CT. ?

## 2021-11-28 ENCOUNTER — Encounter: Payer: Self-pay | Admitting: *Deleted

## 2021-12-05 DIAGNOSIS — J432 Centrilobular emphysema: Secondary | ICD-10-CM | POA: Diagnosis not present

## 2021-12-05 DIAGNOSIS — I25119 Atherosclerotic heart disease of native coronary artery with unspecified angina pectoris: Secondary | ICD-10-CM | POA: Diagnosis not present

## 2021-12-05 DIAGNOSIS — E279 Disorder of adrenal gland, unspecified: Secondary | ICD-10-CM | POA: Diagnosis not present

## 2021-12-06 ENCOUNTER — Other Ambulatory Visit: Payer: Self-pay | Admitting: *Deleted

## 2021-12-06 DIAGNOSIS — I77810 Thoracic aortic ectasia: Secondary | ICD-10-CM

## 2021-12-06 DIAGNOSIS — I25119 Atherosclerotic heart disease of native coronary artery with unspecified angina pectoris: Secondary | ICD-10-CM

## 2021-12-07 ENCOUNTER — Telehealth: Payer: Self-pay | Admitting: Student

## 2021-12-07 NOTE — Telephone Encounter (Signed)
Pt notified of test results and copy to PCP  ?

## 2021-12-07 NOTE — Telephone Encounter (Signed)
  Pt's daughter returning call to get lab and CT result. She said, she will be in and out of the office too and not able to answer the phone as well

## 2021-12-12 DIAGNOSIS — M549 Dorsalgia, unspecified: Secondary | ICD-10-CM | POA: Diagnosis not present

## 2021-12-12 DIAGNOSIS — S239XXA Sprain of unspecified parts of thorax, initial encounter: Secondary | ICD-10-CM | POA: Diagnosis not present

## 2021-12-21 ENCOUNTER — Encounter: Payer: Self-pay | Admitting: Cardiology

## 2021-12-21 ENCOUNTER — Ambulatory Visit (INDEPENDENT_AMBULATORY_CARE_PROVIDER_SITE_OTHER): Payer: Medicare Other | Admitting: Cardiology

## 2021-12-21 VITALS — BP 116/68 | HR 61 | Ht 64.5 in | Wt 108.8 lb

## 2021-12-21 DIAGNOSIS — Z720 Tobacco use: Secondary | ICD-10-CM

## 2021-12-21 DIAGNOSIS — I25119 Atherosclerotic heart disease of native coronary artery with unspecified angina pectoris: Secondary | ICD-10-CM

## 2021-12-21 DIAGNOSIS — I10 Essential (primary) hypertension: Secondary | ICD-10-CM | POA: Diagnosis not present

## 2021-12-21 MED ORDER — TICAGRELOR 60 MG PO TABS: 60.0000 mg | ORAL_TABLET | Freq: Two times a day (BID) | ORAL | 6 refills | Status: DC

## 2021-12-21 NOTE — Progress Notes (Signed)
Cardiology Office Note  Date: 12/21/2021   ID: Melissa Mccall, DOB [birth year], MRN [id number]  PCP:  Remigio Eisenmenger, MD  Cardiologist:  Nona Dell, MD Electrophysiologist:  None   Chief Complaint  Patient presents with   Cardiac follow-up    History of Present Illness: Melissa Mccall is a 71 y.o. female last seen in March.  She is here for a follow-up visit with her daughter.  She does report intermittent chest discomfort on baseline chronic shortness of breath.  She reports compliance with her medications, still bruising on Brilinta.  She is now 6 months out from DES intervention to the mid circumflex/OM 2.  Recent follow-up chest CTA in June showed stable ascending thoracic aortic measurement of 3.5 cm.  She is still smoking cigarettes, we discussed smoking cessation again today.  This has been difficult for her.  Vital signs are stable as noted below.  Past Medical History:  Diagnosis Date   Bruising    Aspirin and Effient, February 2013   CAD (coronary artery disease)    BMS to proximal LAD 06/2011   Cocaine abuse (HCC)    Drug resistance    P2Y12 was 311 (>208) January 2013, therefore Effient (Prasugrel) used.   Essential hypertension    Marijuana abuse    NSTEMI (non-ST elevated myocardial infarction) [facility]) January 2013    Past Surgical History:  Procedure Laterality Date   CESAREAN SECTION     x 2   CORONARY STENT INTERVENTION N/A 05/31/2021   Procedure: CORONARY STENT INTERVENTION;  Surgeon: Marykay Lex, MD;  Location: [facility] INVASIVE CV LAB;  Service: Cardiovascular;  Laterality: N/A;   LEFT HEART CATH AND CORONARY ANGIOGRAPHY N/A 05/31/2021   Procedure: LEFT HEART CATH AND CORONARY ANGIOGRAPHY;  Surgeon: Marykay Lex, MD;  Location: [facility] INVASIVE CV LAB;  Service: Cardiovascular;  Laterality: N/A;   LEFT HEART CATHETERIZATION WITH CORONARY ANGIOGRAM N/A 07/16/2011   Procedure: LEFT HEART CATHETERIZATION WITH CORONARY ANGIOGRAM;  Surgeon:  Kathleene Hazel, MD;  Location: [facility] CATH LAB;  Service: Cardiovascular;  Laterality: N/A;   PERCUTANEOUS CORONARY STENT INTERVENTION (PCI-S) N/A 07/16/2011   Procedure: PERCUTANEOUS CORONARY STENT INTERVENTION (PCI-S);  Surgeon: Kathleene Hazel, MD;  Location: [facility] CATH LAB;  Service: Cardiovascular;  Laterality: N/A;    Current Outpatient Medications  Medication Sig Dispense Refill   amLODipine (NORVASC) 10 MG tablet TAKE 1 TABLET BY MOUTH EVERY DAY 90 tablet 2   aspirin EC 81 MG tablet Take 81 mg by mouth daily.     atorvastatin (LIPITOR) 80 MG tablet Take 1 tablet (80 mg total) by mouth daily. 90 tablet 3   budesonide-formoterol (SYMBICORT) 160-4.5 MCG/ACT inhaler INHALE 2 PUFFS FIRST THING IN THE MORNING AND THEN ANOTHER 2 PUFFS ABOUT 12 HOURS LATER 10.2 each 12   losartan (COZAAR) 100 MG tablet TAKE 1 TABLET BY MOUTH EVERY DAY 90 tablet 3   nitroGLYCERIN (NITROSTAT) 0.4 MG SL tablet PLACE 1 TABLET (0.4 MG TOTAL) UNDER THE TONGUE EVERY 5 (FIVE) MINUTES AS NEEDED FOR CHEST PAIN (UP TO 3 DOSES). 25 tablet 3   ticagrelor (BRILINTA) 60 MG TABS tablet Take 1 tablet (60 mg total) by mouth 2 (two) times daily. 60 tablet 6   famotidine (PEPCID) 20 MG tablet One after supper (Patient not taking: Reported on 12/21/2021) 30 tablet 11   pantoprazole (PROTONIX) 40 MG tablet TAKE 1 TABLET (40 MG TOTAL) BY MOUTH DAILY. TAKE 30-60 MIN BEFORE FIRST MEAL OF THE DAY (Patient not taking: Reported  on 12/21/2021) 90 tablet 0   No current facility-administered medications for this visit.   Allergies:  Patient has no known allergies.   ROS: No syncope.  Physical Exam: VS:  BP 116/68   Pulse 61   Ht 5' 4.5" (1.638 m)   Wt 108 lb 12.8 oz (49.4 kg)   SpO2 97%   BMI 18.39 kg/m , BMI Body mass index is 18.39 kg/m.  Wt Readings from Last 3 Encounters:  12/21/21 108 lb 12.8 oz (49.4 kg)  09/01/21 114 lb 9.6 oz (52 kg)  07/04/21 114 lb 3.2 oz (51.8 kg)    General: Patient appears comfortable at  rest. HEENT: Conjunctiva and lids normal, oropharynx clear. Neck: Supple, no elevated JVP or carotid bruits, no thyromegaly. Lungs: Clear to auscultation, nonlabored breathing at rest. Cardiac: Regular rate and rhythm, no S3 or significant systolic murmur, no pericardial rub.  ECG:  An ECG dated 06/01/2021 was personally reviewed today and demonstrated:  Sinus rhythm.  Recent Labwork:  March 2023: Hemoglobin 14.0, platelets 258 June 2023: Potassium 3.6, BUN 23, creatinine 0.96  Other Studies Reviewed Today:  Cardiac catheterization 05/31/2021:   Mid Cx-2ndMrg lesion is 99% stenosed.   A drug-eluting stent was successfully placed using a STENT ONYX FRONTIER 2.0X15, deployed to 2.2 mm   Post intervention, there is a 0% residual stenosis.  The left ventricular ejection fraction is 55-65% by visual estimate.   -----------------------------   Previously placed Ost LAD to Prox LAD stent (BMS) is  widely patent.   -----------------------------   The left ventricular systolic function is normal.   LV end diastolic pressure is normal.   There is no aortic valve stenosis.   SUMMARY Two-vessel CAD: Widely patent ostial and proximal LAD stent Culprit lesion is 99% mid LCx-OM2  successful Complex DES PCI (Onyx Frontier 2.0 mm x 15 mm deployed to 2.2 mm) Normal LV function with no R WMA.  EF 60 to 65% with normal EDP.     RECOMMENDATIONS Okay for same-day discharge. Will reload antiplatelet agent with Brilinta given history of Plavix P2 Y 12 unresponsiveness. Continue Aggressive Guideline Directed Medical Therapy for cardiovascular risk factors.  Chest CTA 12/05/2021 [facility]): No acute cardiopulmonary disease.  No PE.   Ascending thoracic aorta measures 3.5 cm in greatest diameter and  the proximal arch of the aorta measures 3.2 cm in greatest diameter.   Recommend annual imaging followup by CTA or MRA. This recommendation  follows 2010  ACCF/AHA/AATS/ACR/ASA/SCA/SCAI/SIR/STS/SVM Guidelines  for the Diagnosis and Management of Patients with Thoracic Aortic  Disease. Circulation.2010; 121: R740-C144. Aortic aneurysm NOS  (ICD10-I71.9)   Mild-to-moderate centrilobular emphysematous changes.   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   Assessment and Plan:  1.  CAD status post BMS to the ostial/proximal LAD in 2013 with DES to the circumflex/OM 2 in December 2022.  Plan to reduce Brilinta to 60 mg twice daily and otherwise continue aspirin, Norvasc, Lipitor, Cozaar, and as needed nitroglycerin.  If angina symptoms increase we may need to consider a follow-up angiogram.  Otherwise smoking cessation is absolutely essential, we discussed this today as well.  2.  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  She is planning to establish follow-up with [place] [facility] in [place].  3.  Essential hypertension, blood pressure well controlled today on current regimen.  4.  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  Medication Adjustments/Labs and Tests Ordered: Current  medicines are reviewed at length with the patient today.  Concerns regarding medicines are outlined above.   Tests Ordered: No orders of the defined types were placed in this encounter.   Medication Changes: Meds ordered this encounter  Medications   ticagrelor (BRILINTA) 60 MG TABS tablet    Sig: Take 1 tablet (60 mg total) by mouth 2 (two) times daily.    Dispense:  60 tablet    Refill:  6    12/21/2021 dose decrease    Disposition:  Follow up  3 months.  Signed, Jonelle Sidle, MD, [facility] 12/21/2021 4:40 PM    [facility] [address] [place], [place], [place] [address] Phone: [phone]; Fax: [phone]

## 2021-12-21 NOTE — Patient Instructions (Addendum)
Medication Instructions:  Your physician has recommended you make the following change in your medication:  Decrease brilinta to 60 mg twice daily Continue other medications the same  Labwork: none  Testing/Procedures: none  Follow-Up: Your physician recommends that you schedule a follow-up appointment in: 3 months  Any Other Special Instructions Will Be Listed Below (If Applicable).  If you need a refill on your cardiac medications before your next appointment, please call your pharmacy.

## 2022-01-04 ENCOUNTER — Other Ambulatory Visit: Payer: Self-pay | Admitting: Cardiology

## 2022-01-30 ENCOUNTER — Ambulatory Visit (INDEPENDENT_AMBULATORY_CARE_PROVIDER_SITE_OTHER): Payer: Medicare Other | Admitting: Internal Medicine

## 2022-01-30 ENCOUNTER — Encounter: Payer: Self-pay | Admitting: Internal Medicine

## 2022-01-30 VITALS — BP 112/68 | HR 62 | Temp 97.6°F | Ht 64.0 in | Wt 103.6 lb

## 2022-01-30 DIAGNOSIS — F17210 Nicotine dependence, cigarettes, uncomplicated: Secondary | ICD-10-CM | POA: Diagnosis not present

## 2022-01-30 DIAGNOSIS — J449 Chronic obstructive pulmonary disease, unspecified: Secondary | ICD-10-CM

## 2022-01-30 DIAGNOSIS — I25119 Atherosclerotic heart disease of native coronary artery with unspecified angina pectoris: Secondary | ICD-10-CM | POA: Diagnosis not present

## 2022-01-30 MED ORDER — FAMOTIDINE 20 MG PO TABS: ORAL_TABLET | ORAL | 11 refills | Status: DC

## 2022-01-30 MED ORDER — AZITHROMYCIN 250 MG PO TABS: ORAL_TABLET | ORAL | 0 refills | Status: DC

## 2022-01-30 MED ORDER — PREDNISONE 10 MG PO TABS: ORAL_TABLET | ORAL | 0 refills | Status: DC

## 2022-01-30 MED ORDER — PANTOPRAZOLE SODIUM 40 MG PO TBEC: 40.0000 mg | DELAYED_RELEASE_TABLET | Freq: Every day | ORAL | 2 refills | Status: DC

## 2022-01-30 MED ORDER — BUDESONIDE-FORMOTEROL FUMARATE 160-4.5 MCG/ACT IN AERO: INHALATION_SPRAY | RESPIRATORY_TRACT | 12 refills | Status: DC

## 2022-01-30 NOTE — Patient Instructions (Addendum)
Symbicort  160 Take 2 puffs first thing in am and then another 2 puffs about 12 hours later.    Work on inhaler technique:  relax and gently blow all the way out then take a nice smooth full deep breath back in, triggering the inhaler at same time you start breathing in.  Hold breath in for at least  5 seconds if you can. Blow out symbicort thru nose. Rinse and gargle with water when done.  If mouth or throat bother you at all,  try brushing teeth/gums/tongue with arm and hammer toothpaste/ make a slurry and gargle and spit out.   - remember how golfers warm up by practice swings  Zpak  Prednisone 10 mg take  4 each am x 2 days,   2 each am x 2 days,  1 each am x 2 days and stop   If cough not improving ok to try again Pantoprazole (protonix) 40 mg   Take  30-60 min before first meal of the day and Pepcid (famotidine)  20 mg after supper until return to office - this is the best way to tell whether stomach acid is contributing to your problem.    Best cough medication =  mucinex dm up to 1200 mg every 12 hours as needed   The key is to stop smoking completely before smoking completely stops you!      Please schedule a follow up visit in 3-4  months  with PFTs on return

## 2022-01-30 NOTE — Progress Notes (Unsigned)
Melissa Mccall, female    DOB: September 24, 1950, 71 y.o.   MRN: [id number]   Brief patient profile:  31   yobf active smoker with h/o IHD s/p stent around 2013 with new onset doe 2018/2019 progressive to point of doe MMRC1 = can walk nl pace, flat grade, can't hurry or go uphills or steps s sob  referred to pulmonary clinic in [place]  03/07/2020 by Dr  McDowell's NP      History of Present Illness  03/07/2020  Pulmonary/ 1st office eval/ Keisy Strickler / [place] [facility]  Chief Complaint  Patient presents with   Consult    Shortness of breath with exertion, dry/productive cough with white sputum. Patient has had it for awhile but has got worse. Stress test was good  Dyspnea:  MMRC1 = can walk nl pace, flat grade, can't hurry or go uphills or steps s sob   Cough: p supper and hs mostly dry  Sleep: difficult maybe be partly due to cough SABA use: none  CP x one year  sporadic L ant chest lasts as a long as a minute maybe once or twice a week  Not brought on by deep breath cough/ ex / better with pressure over pect major muscle distribution but not changed with shoulder movement/ lifting with L arm rec The key is to stop smoking completely before smoking completely stops you! Pantoprazole (protonix) 40 mg   Take  30-60 min before first meal of the day and Pepcid (famotidine)  20 mg one @  After  Supper GERD  diet  Please remember to go to the  x-ray department  @  [facility] for your tests - we will call you with the results when they are available    Please schedule a follow up office visit in 6 weeks, call sooner if needed -  pfts on return  Late add  Trachea deviated to R by med mass c/w goiter > ct with contrast needed   04/18/2020  f/u ov/[place] office/Briena Swingler re: GOLD II copd / still smoking, not vaccinated for covid  Chief Complaint  Patient presents with   Follow-up    shortness of breath with exertion  Dyspnea:  MMRC1 = can walk nl pace, flat grade, can't hurry or go uphills  or steps s sob   Cough: sporadic  Sleeping: able to  Lie on side / on a flat bed  SABA use: none  02: none  Rec If breathing or coughing are getting worse then use symbicort 160 up to 2 puff every 12 hours  You only have mild copd, and once you quit smoking it should not get worse.  You are eligible for lung cancer screening we can schedule here at [facility] but let me know if you want [place]  to handle it so you can set up at televisit  with Eric Form NP 1st The key is to stop smoking completely before smoking completely stops you!  Please schedule a follow up visit in 6 months but call sooner if needed - bring your inhaler with you     01/30/2022  f/u ov/[place] office/Shakeitha Umbaugh re: GOLD 2 COPD  maint on ***  Chief Complaint  Patient presents with   Follow-up    Appt for med refills.   Feels breathing has worsened since last ov.    Dyspnea:  MMRC2 = can't walk a nl pace on a flat grade s sob but does fine slow and flat  Cough: worse than  usual ever since last visit /  Sleeping: no problem until am worse white  SABA use: symbicort 160 helps  02: none  Covid status: declined / infected at least once  Lung cancer screening: ***   No obvious day to day or daytime variability or assoc excess/ purulent sputum or mucus plugs or hemoptysis or cp or chest tightness, subjective wheeze or overt sinus or hb symptoms.   *** without nocturnal  or early am exacerbation  of respiratory  c/o's or need for noct saba. Also denies any obvious fluctuation of symptoms with weather or environmental changes or other aggravating or alleviating factors except as outlined above   No unusual exposure hx or h/o childhood pna/ asthma or knowledge of premature birth.  Current Allergies, Complete Past Medical History, Past Surgical History, Family History, and Social History were reviewed in [employer] record.  ROS  The following are not active complaints unless bolded Hoarseness, sore  throat, dysphagia, dental problems, itching, sneezing,  nasal congestion or discharge of excess mucus or purulent secretions, ear ache,   fever, chills, sweats, unintended wt loss or wt gain, classically pleuritic or exertional cp,  orthopnea pnd or arm/hand swelling  or leg swelling, presyncope, palpitations, abdominal pain, anorexia, nausea, vomiting, diarrhea  or change in bowel habits or change in bladder habits, change in stools or change in urine, dysuria, hematuria,  rash, arthralgias, visual complaints, headache, numbness, weakness or ataxia or problems with walking or coordination,  change in mood or  memory.        Current Meds  Medication Sig   amLODipine (NORVASC) 10 MG tablet TAKE 1 TABLET BY MOUTH EVERY DAY   aspirin EC 81 MG tablet Take 81 mg by mouth daily.   atorvastatin (LIPITOR) 80 MG tablet Take 1 tablet (80 mg total) by mouth daily.   budesonide-formoterol (SYMBICORT) 160-4.5 MCG/ACT inhaler INHALE 2 PUFFS FIRST THING IN THE MORNING AND THEN ANOTHER 2 PUFFS ABOUT 12 HOURS LATER   losartan (COZAAR) 100 MG tablet TAKE 1 TABLET BY MOUTH EVERY DAY   nitroGLYCERIN (NITROSTAT) 0.4 MG SL tablet PLACE 1 TABLET (0.4 MG TOTAL) UNDER THE TONGUE EVERY 5 (FIVE) MINUTES AS NEEDED FOR CHEST PAIN (UP TO 3 DOSES).   ticagrelor (BRILINTA) 60 MG TABS tablet Take 1 tablet (60 mg total) by mouth 2 (two) times daily.                  Past Medical History:  Diagnosis Date   Bruising    Aspirin and Effient, February 2013   CAD (coronary artery disease)    BMS to proximal LAD 06/2011   Cocaine abuse (HCC)    Drug resistance    P2Y12 was 311 (>208) January 2013, therefore Effient (Prasugrel) used.   Essential hypertension    Marijuana abuse    NSTEMI (non-ST elevated myocardial infarction) [facility]) January 2013        Objective:    Wt     01/30/2022      103   04/18/20 124 lb 9.6 oz (56.5 kg)  03/07/20 128 lb (58.1 kg)  12/18/19 129 lb (58.5 kg)    Vital signs reviewed  01/30/2022   - Note at rest 02 sats  97% on RA   General appearance:    amb thin bf nad       HEENT : Oropharynx  ***  Nasal turbinates ***   NECK :  without  apparent JVD/ palpable Nodes/TM    LUNGS:  no acc muscle use,  Min barrel  contour chest wall with bilateral  slightly decreased bs s audible wheeze and  without cough on insp or exp maneuvers and min  Hyperresonant  to  percussion bilaterally    CV:  RRR  no s3 or murmur or increase in P2, and no edema   ABD:  soft and nontender with pos end  insp Hoover's  in the supine position.  No bruits or organomegaly appreciated   MS:  Nl gait/ ext warm without deformities Or obvious joint restrictions  calf tenderness, cyanosis or clubbing     SKIN: warm and dry without lesions    NEURO:  alert, approp, nl sensorium with  no motor or cerebellar deficits apparent.                      Assessment

## 2022-01-31 ENCOUNTER — Encounter: Payer: Self-pay | Admitting: Internal Medicine

## 2022-01-31 NOTE — Assessment & Plan Note (Signed)

## 2022-01-31 NOTE — Assessment & Plan Note (Addendum)
Active smoker -  Assoc with atypical cp and neg Myoview  11/27/19  -  03/07/2020   Walked RA  approx   300 ft  @ fast pace on high heels >  stopped due to  Sob with sats still   99% 03/07/2020 recs  :   gerd rx/ return for pfts/ stop smoking  -  PFT's  04/12/20   FEV1 1.43 (75 % ) ratio 0.59  p 16 % improvement from saba p 0 prior to study with DLCO  10.08 (51%) corrects to 2.54 (60%)  for alv volume and FV curve classic curvature s insp truncation - 04/18/2020 rec symbicort 160 2 pffs up to every 12 hours if symptomatic   - 01/30/2022  After extensive coaching inhaler device,  effectiveness =    75% from a baseline of 50%  Poor hfa and continued smoking problematic here so needs to keep working on hfa and consider adding LAMA next ov   For now rx zpak/ mucinex dm and if cough not improved then add gerd rx with f/u pfts in 3-4 months  Each maintenance medication was reviewed in detail including emphasizing most importantly the difference between maintenance and prns and under what circumstances the prns are to be triggered using an action plan format where appropriate.  Total time for H and P, chart review, counseling, reviewing  hfa device(s) and generating customized AVS unique to this office visit / same day charting = 25 min

## 2022-03-16 ENCOUNTER — Other Ambulatory Visit: Payer: Self-pay | Admitting: Cardiology

## 2022-03-27 DIAGNOSIS — Z1321 Encounter for screening for nutritional disorder: Secondary | ICD-10-CM | POA: Diagnosis not present

## 2022-03-27 DIAGNOSIS — I10 Essential (primary) hypertension: Secondary | ICD-10-CM | POA: Diagnosis not present

## 2022-03-27 DIAGNOSIS — R634 Abnormal weight loss: Secondary | ICD-10-CM | POA: Diagnosis not present

## 2022-03-27 DIAGNOSIS — R059 Cough, unspecified: Secondary | ICD-10-CM | POA: Diagnosis not present

## 2022-03-27 DIAGNOSIS — F17210 Nicotine dependence, cigarettes, uncomplicated: Secondary | ICD-10-CM | POA: Diagnosis not present

## 2022-03-27 DIAGNOSIS — J449 Chronic obstructive pulmonary disease, unspecified: Secondary | ICD-10-CM | POA: Diagnosis not present

## 2022-03-27 DIAGNOSIS — I2510 Atherosclerotic heart disease of native coronary artery without angina pectoris: Secondary | ICD-10-CM | POA: Diagnosis not present

## 2022-03-27 DIAGNOSIS — Z0000 Encounter for general adult medical examination without abnormal findings: Secondary | ICD-10-CM | POA: Diagnosis not present

## 2022-03-27 DIAGNOSIS — N958 Other specified menopausal and perimenopausal disorders: Secondary | ICD-10-CM | POA: Diagnosis not present

## 2022-03-27 DIAGNOSIS — E785 Hyperlipidemia, unspecified: Secondary | ICD-10-CM | POA: Diagnosis not present

## 2022-03-27 DIAGNOSIS — R918 Other nonspecific abnormal finding of lung field: Secondary | ICD-10-CM | POA: Diagnosis not present

## 2022-03-27 DIAGNOSIS — Z1159 Encounter for screening for other viral diseases: Secondary | ICD-10-CM | POA: Diagnosis not present

## 2022-03-27 DIAGNOSIS — Z1231 Encounter for screening mammogram for malignant neoplasm of breast: Secondary | ICD-10-CM | POA: Diagnosis not present

## 2022-03-27 DIAGNOSIS — Z1211 Encounter for screening for malignant neoplasm of colon: Secondary | ICD-10-CM | POA: Diagnosis not present

## 2022-03-29 ENCOUNTER — Ambulatory Visit: Payer: Medicare Other | Attending: Student | Admitting: Student

## 2022-03-29 ENCOUNTER — Encounter: Payer: Self-pay | Admitting: Student

## 2022-03-29 VITALS — BP 110/80 | HR 63 | Ht 64.0 in | Wt 110.6 lb

## 2022-03-29 DIAGNOSIS — I10 Essential (primary) hypertension: Secondary | ICD-10-CM

## 2022-03-29 DIAGNOSIS — I77810 Thoracic aortic ectasia: Secondary | ICD-10-CM | POA: Diagnosis not present

## 2022-03-29 DIAGNOSIS — E785 Hyperlipidemia, unspecified: Secondary | ICD-10-CM | POA: Insufficient documentation

## 2022-03-29 DIAGNOSIS — I2510 Atherosclerotic heart disease of native coronary artery without angina pectoris: Secondary | ICD-10-CM

## 2022-03-29 DIAGNOSIS — I25119 Atherosclerotic heart disease of native coronary artery with unspecified angina pectoris: Secondary | ICD-10-CM

## 2022-03-29 MED ORDER — TICAGRELOR 60 MG PO TABS: 60.0000 mg | ORAL_TABLET | Freq: Two times a day (BID) | ORAL | 3 refills | Status: DC

## 2022-03-29 MED ORDER — AMLODIPINE BESYLATE 5 MG PO TABS: 5.0000 mg | ORAL_TABLET | Freq: Every day | ORAL | 3 refills | Status: DC

## 2022-03-29 NOTE — Progress Notes (Signed)
Cardiology Office Note    Date:  03/29/2022   ID:  Melissa Mccall, DOB [birth year], MRN [id number]  PCP:  Virl Diamond, MD  Cardiologist: Nona Dell, MD    Chief Complaint  Patient presents with   Follow-up    3 month visit    History of Present Illness:    Melissa Mccall is a 71 y.o. female with past medical history of CAD (s/p BMS to proximal LAD in 2013, cath in 05/2021 showing patent LAD stent but noted to have 99% 2nd OM stenosis treated with DES placement), HTN, HLD, COPD, dilation of ascending thoracic aorta (at 3.6 cm in 11/2020 and 3.5 cm in 11/2021) and history of substance abuse who presents to the office today for 42-month follow-up.   She was last examined by Dr. Diona Browner in 12/2021 and reported having intermittent chest discomfort along with baseline dyspnea on exertion. She was still smoking cigarettes and cessation was advised. Brilinta was reduced to 60 mg twice daily given frequent bruising and she was continued on ASA, Amlodipine, Lipitor, Losartan and PRN SL NTG.   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 She has been experiencing a productive cough and was recently started on Azithromycin and Prednisone by her PCP. No specific orthopnea, PND or pitting edema. She has lost weight and they have noticed her blood pressure is now lower than her prior baseline. She does experience intermittent dizziness with positional changes.  In reviewing her current PO intake, she consumes butter, fried foods and lots of red meat (reports having 3 cheeseburgers yesterday).  Past Medical History:  Diagnosis Date   Bruising    Aspirin and Effient, February 2013   CAD (coronary artery disease)    a. s/p BMS to proximal LAD in 2013 b. cath in 05/2021 showing patent LAD stent but noted to have 99% 2nd OM stenosis treated with DES    Cocaine abuse (HCC)    Drug resistance    P2Y12 was 311 (>208) January 2013, therefore Effient (Prasugrel) used.   Essential hypertension    Marijuana abuse    NSTEMI (non-ST elevated myocardial infarction) (HCC) 06/19/2011    Past Surgical History:  Procedure Laterality Date   CESAREAN SECTION     x 2   CORONARY STENT INTERVENTION N/A 05/31/2021   Procedure: CORONARY STENT INTERVENTION;  Surgeon: Marykay Lex, MD;  Location: [facility] INVASIVE CV LAB;  Service: Cardiovascular;  Laterality: N/A;   LEFT HEART CATH AND CORONARY ANGIOGRAPHY N/A 05/31/2021   Procedure: LEFT HEART CATH AND CORONARY ANGIOGRAPHY;  Surgeon: Marykay Lex, MD;  Location: [facility] INVASIVE CV LAB;  Service: Cardiovascular;  Laterality: N/A;   LEFT HEART CATHETERIZATION WITH CORONARY ANGIOGRAM N/A 07/16/2011   Procedure: LEFT HEART CATHETERIZATION WITH CORONARY ANGIOGRAM;  Surgeon: Kathleene Hazel, MD;  Location: [facility] CATH LAB;  Service: Cardiovascular;  Laterality: N/A;   PERCUTANEOUS CORONARY STENT INTERVENTION (PCI-S) N/A 07/16/2011   Procedure: PERCUTANEOUS CORONARY STENT INTERVENTION (PCI-S);  Surgeon: Kathleene Hazel, MD;  Location: [facility] CATH LAB;  Service: Cardiovascular;  Laterality: N/A;    Current Medications: Outpatient Medications Prior to Visit  Medication Sig Dispense Refill   aspirin EC 81 MG tablet Take 81 mg by mouth daily.     atorvastatin (LIPITOR) 80 MG tablet Take 1 tablet (80 mg total) by mouth daily. 90 tablet  3   azithromycin (ZITHROMAX) 250 MG tablet Take 2 on day one then 1 daily x 4 days 6 tablet 0   budesonide-formoterol (SYMBICORT) 160-4.5 MCG/ACT inhaler INHALE 2 PUFFS FIRST THING IN THE MORNING AND THEN ANOTHER 2 PUFFS ABOUT 12 HOURS LATER 10.2 each 12   buPROPion (WELLBUTRIN SR) 150 MG 12 hr tablet Take 150 mg by mouth 2 (two) times daily.     [facility] VITAMIN B12 1000 MCG tablet Take 1,000 mcg by mouth daily.     losartan (COZAAR) 100 MG tablet TAKE 1 TABLET BY MOUTH EVERY DAY 90  tablet 3   nitroGLYCERIN (NITROSTAT) 0.4 MG SL tablet PLACE 1 TABLET (0.4 MG TOTAL) UNDER THE TONGUE EVERY 5 (FIVE) MINUTES AS NEEDED FOR CHEST PAIN (UP TO 3 DOSES). 25 tablet 3   predniSONE (DELTASONE) 10 MG tablet Take  4 each am x 2 days,   2 each am x 2 days,  1 each am x 2 days and stop 14 tablet 0   Vitamin D, Ergocalciferol, (DRISDOL) 1.25 MG (50000 UNIT) CAPS capsule Take by mouth.     amLODipine (NORVASC) 10 MG tablet TAKE 1 TABLET BY MOUTH EVERY DAY 90 tablet 2   ticagrelor (BRILINTA) 60 MG TABS tablet Take 1 tablet (60 mg total) by mouth 2 (two) times daily. 60 tablet 6   No facility-administered medications prior to visit.     Allergies:   Patient has no known allergies.   Social History   Socioeconomic History   Marital status: Single    Spouse name: Not on file   Number of children: Not on file   Years of education: Not on file   Highest education level: Not on file  Occupational History   Not on file  Tobacco Use   Smoking status: Every Day    Packs/day: 0.25    Years: 40.00    Total pack years: 10.00    Types: Cigarettes    Start date: 07/13/1969   Smokeless tobacco: Never   Tobacco comments:    smokes 1/2 pack per day 04/18/2020  Vaping Use   Vaping Use: Former  Substance and Sexual Activity   Alcohol use: Yes    Alcohol/week: 0.0 standard drinks of alcohol    Comment: 40 oz beer/day and sometimes a bottle of wine   Drug use: Yes    Types: Marijuana   Sexual activity: Yes  Other Topics Concern   Not on file  Social History Narrative   Lives in [place].  Doesn't work or exercise.     Social Determinants of Health   Financial Resource Strain: Not on file  Food Insecurity: Not on file  Transportation Needs: Not on file  Physical Activity: Not on file  Stress: Not on file  Social Connections: Not on file     Family History:  The patient's family history includes Colon cancer in her brother; Diabetes in her mother; Hypertension in her father and mother.    Review of Systems:    Please see the history of present illness.     All other systems reviewed and are otherwise negative except as noted above.   Physical Exam:    VS:  BP 110/80   Pulse 63   Ht 5\' 4"  (1.626 m)   Wt 110 lb 9.6 oz (50.2 kg)   SpO2 98%   BMI 18.98 kg/m    General: Pleasant, thin female appearing in no acute distress. Head: Normocephalic, atraumatic. Neck: No carotid bruits. JVD not  elevated.  Lungs: Respirations regular and unlabored, without wheezes or rales.  Heart: Regular rate and rhythm. No S3 or S4.  No murmur, no rubs, or gallops appreciated. Abdomen: Appears non-distended. No obvious abdominal masses. Msk:  Strength and tone appear normal for age. No obvious joint deformities or effusions. Extremities: No clubbing or cyanosis. No pitting edema.  Distal pedal pulses are 2+ bilaterally. Neuro: Alert and oriented X 3. Moves all extremities spontaneously. No focal deficits noted. Psych:  Responds to questions appropriately with a normal affect. Skin: No rashes or lesions noted  Wt Readings from Last 3 Encounters:  03/29/22 110 lb 9.6 oz (50.2 kg)  01/30/22 103 lb 9.6 oz (47 kg)  12/21/21 108 lb 12.8 oz (49.4 kg)     Studies/Labs Reviewed:   EKG:  EKG is not ordered today.   Recent Labs: No results found for requested labs within last 365 days.   Lipid Panel    Component Value Date/Time   CHOL 189 07/15/2011 0525   TRIG 109 07/15/2011 0525   HDL 67 07/15/2011 0525   CHOLHDL 2.8 07/15/2011 0525   VLDL 22 07/15/2011 0525   LDLCALC 100 (H) 07/15/2011 0525    Additional studies/ records that were reviewed today include:   Echocardiogram: 11/2019 IMPRESSIONS     1. Left ventricular ejection fraction, by estimation, is 60 to 65%. The  left ventricle has normal function. The left ventricle has no regional  wall motion abnormalities. There is mild left ventricular hypertrophy.  Left ventricular diastolic parameters  were normal.   2.  Right ventricular systolic function is normal. The right ventricular  size is normal.   3. The mitral valve is normal in structure. No evidence of mitral valve  regurgitation. No evidence of mitral stenosis.   4. The aortic valve is normal in structure. Aortic valve regurgitation is  not visualized. No aortic stenosis is present.   5. The inferior vena cava is normal in size with greater than 50%  respiratory variability, suggesting right atrial pressure of 3 mmHg.   LHC: 05/2021 Mid Cx-2ndMrg lesion is 99% stenosed.   A drug-eluting stent was successfully placed using a STENT ONYX FRONTIER 2.0X15, deployed to 2.2 mm   Post intervention, there is a 0% residual stenosis.  The left ventricular ejection fraction is 55-65% by visual estimate.   -----------------------------   Previously placed Ost LAD to Prox LAD stent (BMS) is  widely patent.   -----------------------------   The left ventricular systolic function is normal.   LV end diastolic pressure is normal.   There is no aortic valve stenosis.   SUMMARY Two-vessel CAD: Widely patent ostial and proximal LAD stent Culprit lesion is 99% mid LCx-OM2  successful Complex DES PCI (Onyx Frontier 2.0 mm x 15 mm deployed to 2.2 mm) Normal LV function with no R WMA.  EF 60 to 65% with normal EDP.     RECOMMENDATIONS Okay for same-day discharge. Will reload antiplatelet agent with Brilinta given history of Plavix P2 Y 12 unresponsiveness. Continue Aggressive Guideline Directed Medical Therapy for cardiovascular risk factors.     Follow-up with Dr. Diona Browner.  Assessment:    1. Coronary artery disease involving native coronary artery of native heart without angina pectoris   2. Essential hypertension   3. Hyperlipidemia LDL goal <70   4. Ascending aorta dilation (HCC)      Plan:   In order of problems listed above:  1. CAD - She is s/p BMS to proximal LAD in  2013 and cath in 05/2021 showed a patent LAD stent but she was noted  to have 99% 2nd OM stenosis treated with DES placement.  - She has intermittent dyspnea in the setting of COPD but denies any progressive dyspnea on exertion or recent chest pain. - Will continue current medical therapy with ASA 81 mg daily, Brilinta 60 mg twice daily and Atorvastatin 80 mg daily. She has not been on beta-blocker therapy due to intermittent bradycardia. She will be a year out from stent placement in 05/2022 and I will send a message to Dr. Domenic Polite today to see if Melissa Mccall can be stopped at that time.  2. HTN - Her blood pressure is at 110/80 during today's visit but she reports this has been lower when checked at home and prior office visits and she does experience occasional dizziness with positional changes. Standing orthostatics checked today and negative. - She is currently on Amlodipine 10 mg daily and Losartan 100 mg daily. I did recommend that we reduce Amlodipine to 5 mg daily and they continue to follow her BP at home.  3. HLD - FLP earlier this month showed total cholesterol 212, HDL 94, triglycerides 77 and LDL 102. She is currently on Atorvastatin 80 mg daily but her daughter is concerned about her compliance with this in the past. Will recheck an FLP and LFT's in 3 months. If LDL remains above goal, would recommend the addition of Zetia.  We also discussed the importance of dietary changes.  4. Dilation of Ascending Thoracic Aorta - This measured 3.6 cm in 11/2020 and 3.5 cm in 11/2021. Would anticipate repeat imaging in 11/2022.   Medication Adjustments/Labs and Tests Ordered: Current medicines are reviewed at length with the patient today.  Concerns regarding medicines are outlined above.  Medication changes, Labs and Tests ordered today are listed in the Patient Instructions below. Patient Instructions  Medication Instructions:  Your physician has recommended you make the following change in your medication:  -Decrease Amlodipine to 5 mg tablets  daily   Labwork: In 3 Months: -Fasting Lipid Panel- Nothing to eat/drink at least 6 hours prior to having lab drawn -LFT (Hepatic Function)  Testing/Procedures: None  Follow-Up: Follow up with Dr. Domenic Polite in 6 months in the [facility].   Any Other Special Instructions Will Be Listed Below (If Applicable).     If you need a refill on your cardiac medications before your next appointment, please call your pharmacy.    Signed, Erma Heritage, PA-C  03/29/2022 5:17 PM    [facility] [address] [place], [place] [address] Phone: [phone] Fax: [phone]

## 2022-03-29 NOTE — Patient Instructions (Signed)
Medication Instructions:  Your physician has recommended you make the following change in your medication:  -Decrease Amlodipine to 5 mg tablets daily   Labwork: In 3 Months: -Fasting Lipid Panel- Nothing to eat/drink at least 6 hours prior to having lab drawn -LFT (Hepatic Function)  Testing/Procedures: None  Follow-Up: Follow up with Dr. Domenic Polite in 6 months in the [facility].   Any Other Special Instructions Will Be Listed Below (If Applicable).     If you need a refill on your cardiac medications before your next appointment, please call your pharmacy.

## 2022-03-30 ENCOUNTER — Telehealth: Payer: Self-pay | Admitting: Student

## 2022-03-30 NOTE — Telephone Encounter (Signed)
Pt notified and voiced understanding 

## 2022-03-30 NOTE — Telephone Encounter (Signed)
    Please let the patient know I did review with Dr. Domenic Polite and she can stop Brilinta after 05/31/2022.  Would continue with ASA 81 mg daily along with her other cardiac medications.  Signed, Erma Heritage, PA-C 03/30/2022, 4:12 PM

## 2022-05-24 DIAGNOSIS — Z1231 Encounter for screening mammogram for malignant neoplasm of breast: Secondary | ICD-10-CM | POA: Diagnosis not present

## 2022-06-01 DIAGNOSIS — Z1231 Encounter for screening mammogram for malignant neoplasm of breast: Secondary | ICD-10-CM | POA: Diagnosis not present

## 2022-06-20 IMAGING — CT CT CHEST W/O CM
2 of 4 series · 15 of 36 positions shown, 18 images · non-contrast
Comparison: None.

CLINICAL DATA: Right-sided chest pain and shortness of breath 1
year. Ex-smoker. Quit smoking 1 month ago. Screening for lung
cancer.

EXAM:
CT CHEST WITHOUT CONTRAST
TECHNIQUE: Multidetector CT imaging of the chest was performed following the
standard protocol without IV contrast.

[Series 2: routine chest without · axial · non-contrast · 0.71mm/px · z∈[+1078,+1330]mm · 12 of 150 slices shown, 15 images]
[im 12/150  mediastinal]
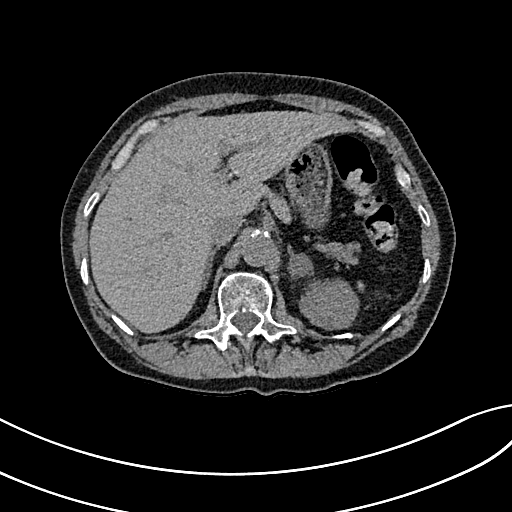
[im 12/150  lung]
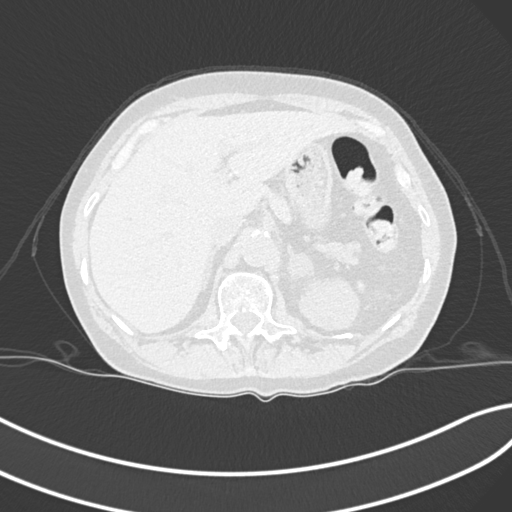
[im 23/150  lung]
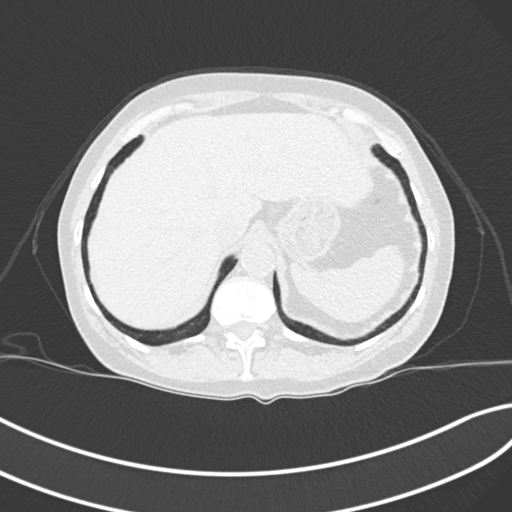
[im 35/150  lung]
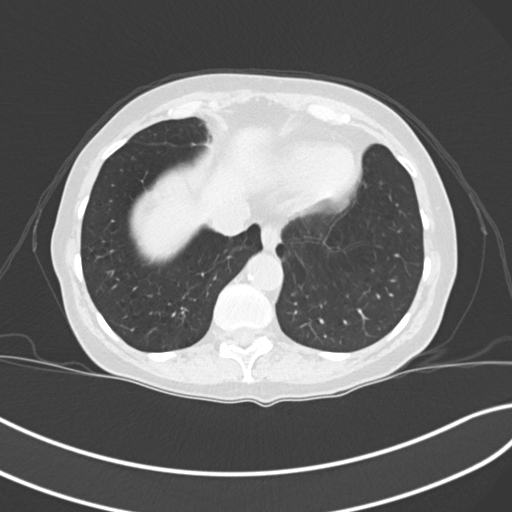
[im 46/150  lung]
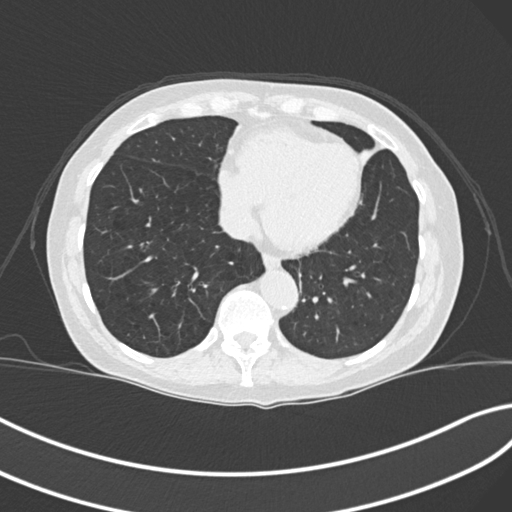
[im 58/150  mediastinal]
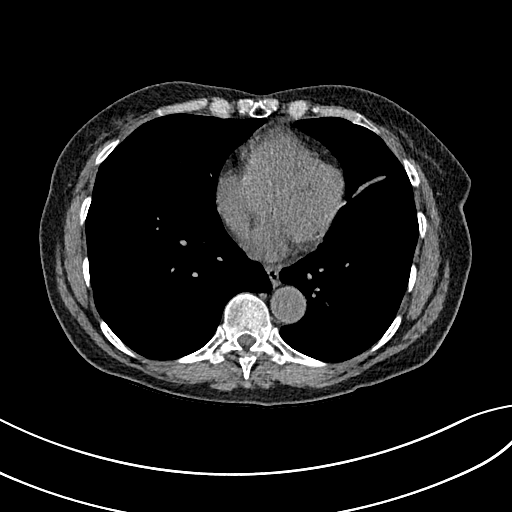
[im 58/150  lung]
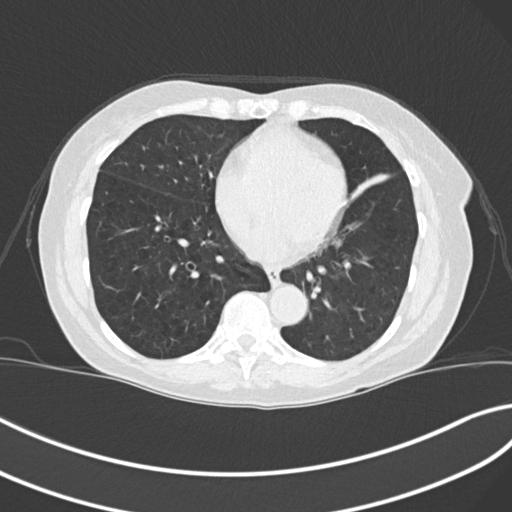
[im 69/150  lung]
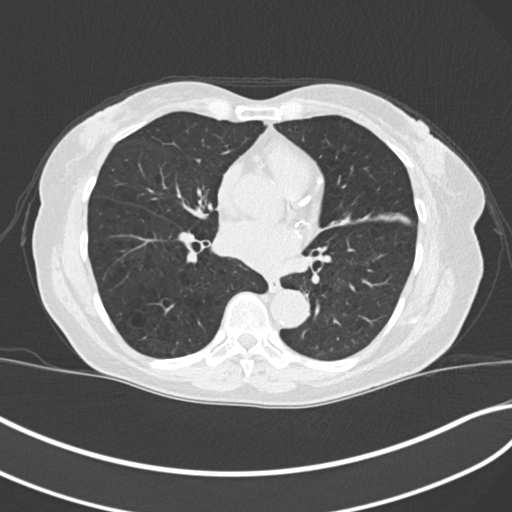
[im 81/150  lung]
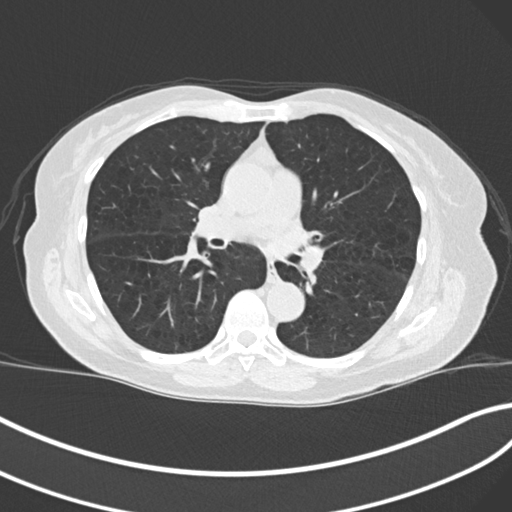
[im 92/150  lung]
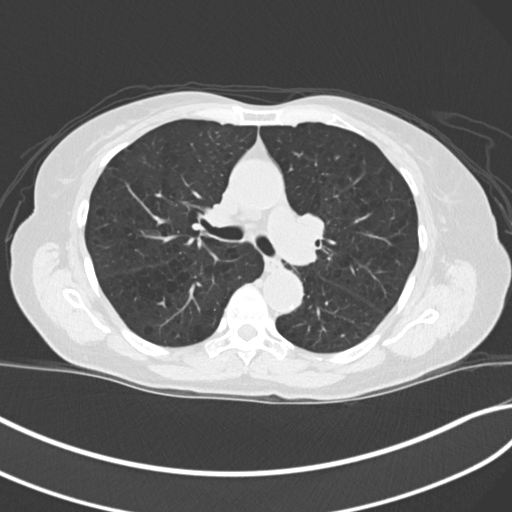
[im 104/150  mediastinal]
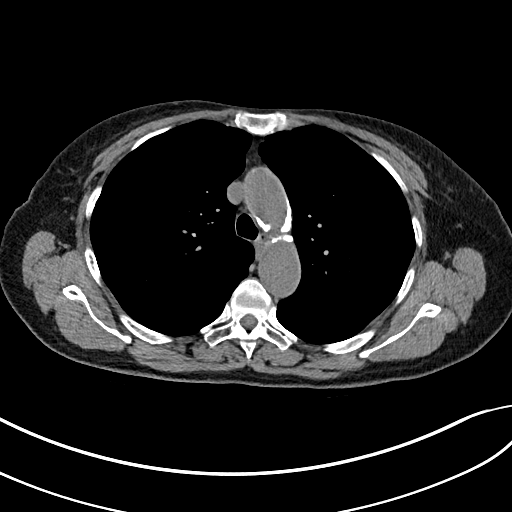
[im 104/150  lung]
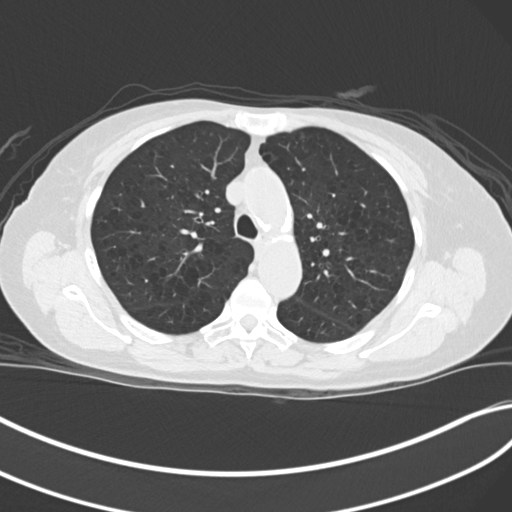
[im 115/150  lung]
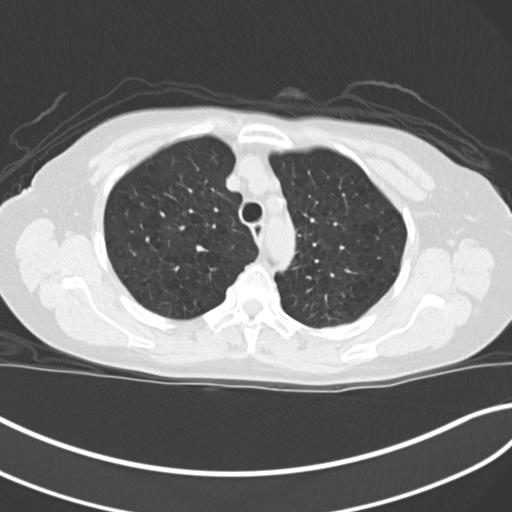
[im 127/150  lung]
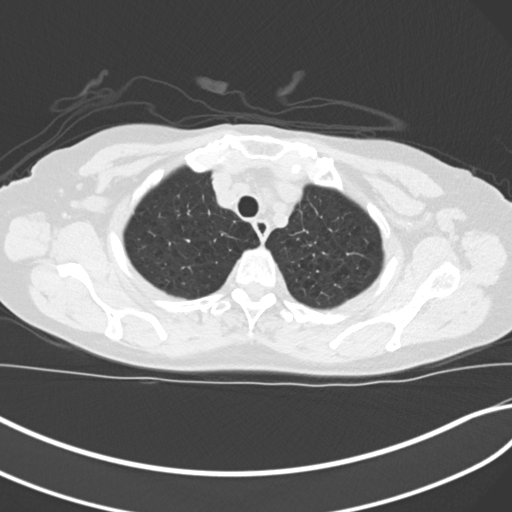
[im 138/150  lung]
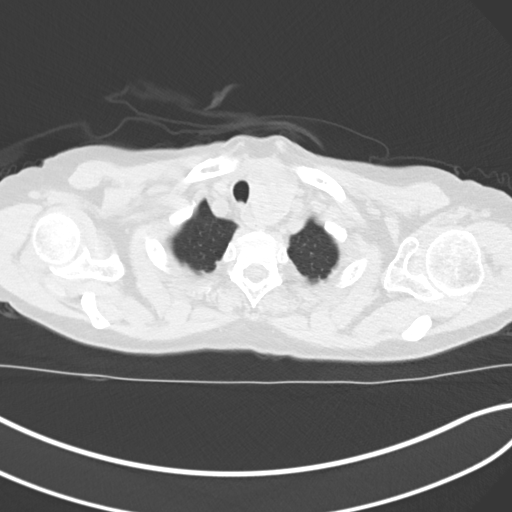

[Series 5: coronal · coronal · 0.59mm/px · 3 of 130 slices shown]
[im 26/130  lung]
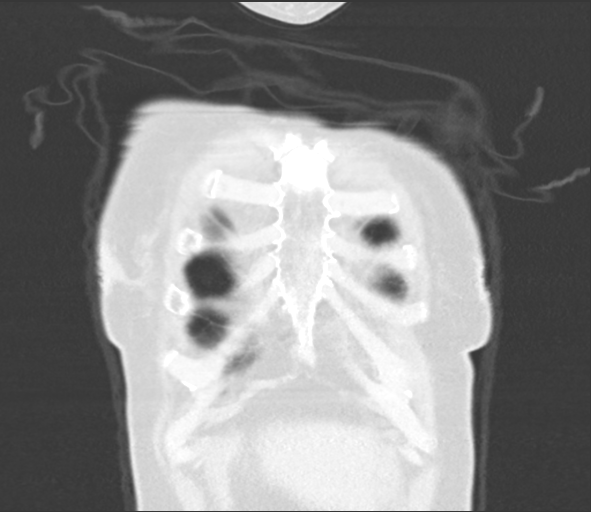
[im 52/130  lung]
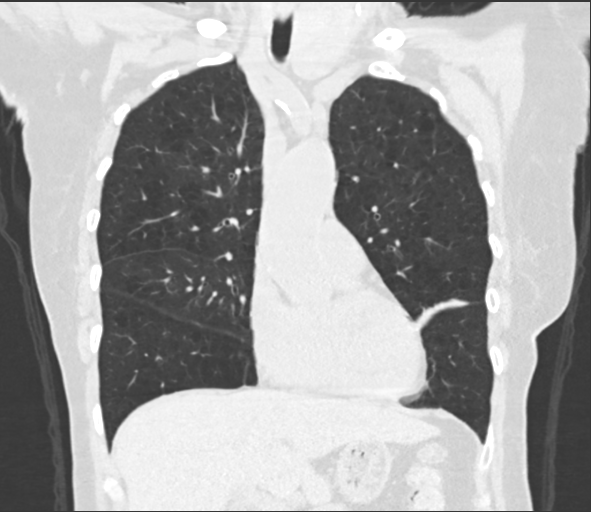
[im 78/130  lung]
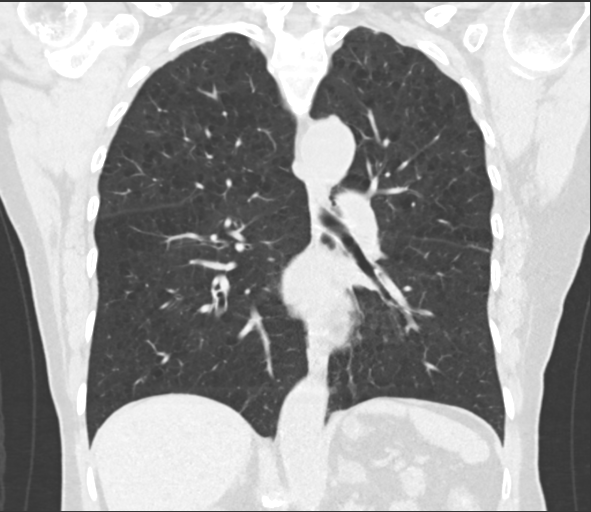

[15 of 36 positions shown; findings below may reference images not displayed]

FINDINGS: Cardiovascular: Heart is normal size. Calcified plaque over the
coronary arteries with suggestion of a stent over the left anterior
descending coronary artery. Ascending thoracic aorta measures
cm. There is minimal calcified plaque over the thoracic aorta.
Remaining vascular structures are unremarkable.

Mediastinum/Nodes: No mediastinal or hilar adenopathy. Evidence of
substernal goiter.

Lungs/Pleura: Lungs are adequately inflated with mild centrilobular
emphysematous disease. No focal airspace consolidation or effusion.
Linear scarring/atelectasis over the lingula. No significant
pulmonary nodules/masses. Airways are normal.

Upper Abdomen: Calcified plaque over the abdominal aorta. 1.5 cm
left adrenal mass likely an adenoma. No acute findings.

Musculoskeletal: No focal abnormality.
IMPRESSION: 1. No acute cardiopulmonary disease.
2. Mild centrilobular emphysematous disease. Linear
scarring/atelectasis over the lingula.
3. Emphysema and aortic atherosclerosis. Atherosclerotic coronary
artery disease with suggestion of a stent over the left anterior
descending coronary artery.
4. 1.5 cm left adrenal mass likely an adenoma.
5. Ascending thoracic aorta measures 3.6 cm. Recommend annual
imaging followup by CTA or MRA. This recommendation follows 4232
ACCF/AHA/AATS/ACR/ASA/SCA/MARCELO JESUS/ESTEPHANIE/OC/NIELSEN Guidelines for the
Diagnosis and Management of Patients with Thoracic Aortic Disease.
Circulation.4232; 121: [id number]. Aortic aneurysm NOS (RH6IU-TMH.N).

Aortic Atherosclerosis (RH6IU-NTF.F) and Emphysema (RH6IU-VNQ.U).

## 2022-07-23 ENCOUNTER — Other Ambulatory Visit: Payer: Self-pay | Admitting: Cardiology

## 2022-09-28 ENCOUNTER — Other Ambulatory Visit: Payer: Self-pay | Admitting: *Deleted

## 2022-09-28 ENCOUNTER — Encounter: Payer: Self-pay | Admitting: Cardiology

## 2022-09-28 ENCOUNTER — Ambulatory Visit: Payer: Medicare Other | Attending: Cardiology | Admitting: Cardiology

## 2022-09-28 VITALS — BP 132/78 | HR 67 | Ht 64.0 in | Wt 112.0 lb

## 2022-09-28 DIAGNOSIS — E782 Mixed hyperlipidemia: Secondary | ICD-10-CM

## 2022-09-28 DIAGNOSIS — I10 Essential (primary) hypertension: Secondary | ICD-10-CM | POA: Diagnosis present

## 2022-09-28 DIAGNOSIS — Z79899 Other long term (current) drug therapy: Secondary | ICD-10-CM

## 2022-09-28 DIAGNOSIS — I25119 Atherosclerotic heart disease of native coronary artery with unspecified angina pectoris: Secondary | ICD-10-CM

## 2022-09-28 NOTE — Patient Instructions (Addendum)
Medication Instructions:  Your physician recommends that you continue on your current medications as directed. Please refer to the Current Medication list given to you today.  Labwork: Your physician recommends that you return for a FASTING lipid profile: within the next 2 weeks. Please do not eat or drink for at least 8 hours when you have this done. You may take your medications that morning with a sip of water.  Testing/Procedures: none  Follow-Up: Your physician recommends that you schedule a follow-up appointment in: 6 months  Any Other Special Instructions Will Be Listed Below (If Applicable).  If you need a refill on your cardiac medications before your next appointment, please call your pharmacy.

## 2022-09-28 NOTE — Progress Notes (Signed)
    Cardiology Office Note  Date: 09/28/2022   ID: Melissa Mccall, DOB 1950/09/20, MRN [id number]  History of Present Illness: Melissa Mccall is a 72 y.o. female last seen in October 2023 by Ms. Strader PA-C, I reviewed the note.  She is here today with her daughter for a follow-up visit.  She does not report any progressive angina or nitroglycerin use.  States that she has been taking her medications regularly including the last increase in Lipitor to 80 mg daily.  She is due for follow-up lipid panel.  We discussed her diet today, she admits that she does eat "not the best" sometimes.  Blood pressure reasonable today, she was having some dizziness resulting in down titration of her antihypertensives.  ECG today shows normal sinus rhythm.  Physical Exam: VS:  BP 132/78   Pulse 67   Ht 5\' 4"  (1.626 m)   Wt 112 lb (50.8 kg)   SpO2 97%   BMI 19.22 kg/m , BMI Body mass index is 19.22 kg/m.  Wt Readings from Last 3 Encounters:  09/28/22 112 lb (50.8 kg)  03/29/22 110 lb 9.6 oz (50.2 kg)  01/30/22 103 lb 9.6 oz (47 kg)    General: Patient appears comfortable at rest. HEENT: Conjunctiva and lids normal. Neck: Supple, no elevated JVP or carotid bruits. Lungs: Clear to auscultation, nonlabored breathing at rest. Cardiac: Regular rate and rhythm, no S3 or significant systolic murmur. Extremities: No pitting edema.  ECG:  An ECG dated 05/31/2021 was personally reviewed today and demonstrated:  Sinus rhythm.  Labwork:  October 2023: Potassium 4.2, BUN 17, creatinine 1.13, AST 21, ALT 22, cholesterol 212, triglycerides 77, HDL 94, LDL 102, hemoglobin 14.1, platelets 279, TSH 1.22  Other Studies Reviewed Today:  No interval cardiac testing for review today.  Assessment and Plan:  1.  CAD status post BMS to the proximal LAD in 2013 with DES to the OM 2 in 2022.  LVEF 60 to 65% as of 2022.  She completed 1 year course of Brilinta as of December 2023.  Currently without active angina  or nitroglycerin use.  Continue aspirin, Lipitor, and as needed nitroglycerin.  2.  Essential hypertension.  Blood pressure control reasonable today on Norvasc and Cozaar.  No changes were made.  3.  Mixed hyperlipidemia.  Tolerating Lipitor at 80 mg daily.  Follow-up FLP.  4.  Asymptomatic mild ascending aortic dilatation at 3.5 cm by chest CTA in June 2023.  Can consider reimaging around the time of her next visit.  Disposition:  Follow up  6 months.  Signed, Jonelle Sidle, M.D., F.A.C.C. [facility]

## 2023-01-06 ENCOUNTER — Other Ambulatory Visit: Payer: Self-pay | Admitting: Cardiology

## 2023-01-23 ENCOUNTER — Telehealth: Payer: Self-pay | Admitting: Cardiology

## 2023-01-23 NOTE — Telephone Encounter (Signed)
Called pt's daughter concerning a apt change- pt's daughter Boneta Lucks stated that pt may need to be seen sooner than her Oct apt with Dr. Diona Browner.  Daughter stated that pt has been having chest pain on and off for about 3 months now, but it's gotten worse in July.   Pt did take a nitro on 7/21  Please give daughter a call [phone]

## 2023-01-23 NOTE — Telephone Encounter (Signed)
Pt's daughter was returning nurse call and is requesting a callback. Please advise

## 2023-01-24 NOTE — Telephone Encounter (Signed)
Per daughter Boneta Lucks, patient has been reporting more frequent chest pain. Reports she last took nitroglycerin on 01/06/2023 and symptoms resolved. Daughter requesting sooner appointment than October. Gave 1st available to see Dunn @3 :30 pm 02/22/2023 at the [place] office. Also placed on wait list. Advised if symptoms got worse, to go to the ED for an evaluation. Verbalized understanding of plan.

## 2023-02-01 ENCOUNTER — Other Ambulatory Visit: Payer: Self-pay | Admitting: Internal Medicine

## 2023-02-22 ENCOUNTER — Ambulatory Visit: Payer: Medicare Other | Attending: Physician Assistant | Admitting: Physician Assistant

## 2023-02-22 ENCOUNTER — Encounter: Payer: Self-pay | Admitting: *Deleted

## 2023-02-22 ENCOUNTER — Other Ambulatory Visit: Payer: Self-pay

## 2023-02-22 ENCOUNTER — Encounter: Payer: Self-pay | Admitting: Physician Assistant

## 2023-02-22 VITALS — BP 130/72 | HR 70 | Ht 64.0 in | Wt 112.2 lb

## 2023-02-22 DIAGNOSIS — I2510 Atherosclerotic heart disease of native coronary artery without angina pectoris: Secondary | ICD-10-CM

## 2023-02-22 DIAGNOSIS — I77810 Thoracic aortic ectasia: Secondary | ICD-10-CM | POA: Diagnosis present

## 2023-02-22 DIAGNOSIS — I10 Essential (primary) hypertension: Secondary | ICD-10-CM | POA: Diagnosis present

## 2023-02-22 DIAGNOSIS — Z79899 Other long term (current) drug therapy: Secondary | ICD-10-CM

## 2023-02-22 DIAGNOSIS — E785 Hyperlipidemia, unspecified: Secondary | ICD-10-CM | POA: Diagnosis present

## 2023-02-22 DIAGNOSIS — R079 Chest pain, unspecified: Secondary | ICD-10-CM | POA: Diagnosis present

## 2023-02-22 MED ORDER — ISOSORBIDE MONONITRATE ER 30 MG PO TB24: 15.0000 mg | ORAL_TABLET | Freq: Every day | ORAL | 3 refills | Status: DC

## 2023-02-22 NOTE — Patient Instructions (Signed)
Medication Instructions:   Start Imdur 15 mg Daily   *If you need a refill on your cardiac medications before your next appointment, please call your pharmacy*   Lab Work: Your physician recommends that you return for lab work in: Fasting   If you have labs (blood work) drawn today and your tests are completely normal, you will receive your results only by: MyChart Message (if you have MyChart) OR A paper copy in the mail If you have any lab test that is abnormal or we need to change your treatment, we will call you to review the results.   Testing/Procedures: Your physician has requested that you have a lexiscan myoview. For further information please visit [email or web address] Please follow instruction sheet, as given.  Your physician has requested that you have an echocardiogram. Echocardiography is a painless test that uses sound waves to create images of your heart. It provides your doctor with information about the size and shape of your heart and how well your heart's chambers and valves are working. This procedure takes approximately one hour. There are no restrictions for this procedure. Please do NOT wear cologne, perfume, aftershave, or lotions (deodorant is allowed). Please arrive 15 minutes prior to your appointment time.  Non-Cardiac CT Angiography (CTA), is a special type of CT scan that uses a computer to produce multi-dimensional views of major blood vessels throughout the body. In CT angiography, a contrast material is injected through an IV to help visualize the blood vessels    Follow-Up: At [facility], you and your health needs are our priority.  As part of our continuing mission to provide you with exceptional heart care, we have created designated Provider Care Teams.  These Care Teams include your primary Cardiologist (physician) and Advanced Practice Providers (APPs -  Physician Assistants and Nurse Practitioners) who all work together to provide you  with the care you need, when you need it.  We recommend signing up for the patient portal called "MyChart".  Sign up information is provided on this After Visit Summary.  MyChart is used to connect with patients for Virtual Visits (Telemedicine).  Patients are able to view lab/test results, encounter notes, upcoming appointments, etc.  Non-urgent messages can be sent to your provider as well.   To learn more about what you can do with MyChart, go to [email or web address].    Your next appointment:   3 -4 week(s)  Provider:   You may see Melissa Dell, Melissa Mccall or one of the following Advanced Practice Providers on your designated Care Team:   Randall An, PA-C  Jacolyn Reedy, PA-C     Other Instructions Thank you for choosing [facility]!

## 2023-02-22 NOTE — Progress Notes (Signed)
Cardiology Office Note    Date:  02/22/2023  ID:  Melissa Mccall, DOB 16-Apr-1951, MRN [id number] PCP:  Virl Diamond, MD  Cardiologist:  Nona Dell, MD  Electrophysiologist:  None   Chief Complaint: chest pain  History of Present Illness: .    Melissa Mccall is a 72 y.o. 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 Last echo 2021 EF 60-65%, mild LVH. At time of last cath 05/2021, there was no residual disease aside from vessel that received PCI. LVEF was normal. At 12/2021 follow-up her Brilinta was decreased to 60mg  BID given frequent bruising then discontinued after completion of 1 year of therapy. She has not been on beta blocker due to intermittent bradycardia. Amlodipine previously reduced due to soft BP. She has seen Dr. Sherene Sires for COPD as she has a history of exertional dyspnea. She reports ongoing tobacco and THC use. She denies illict drug use.   She presents today for paroxysms of chest pain that have been happening the last few months, about 1x a week, described as a squeezing sensation in various focal spots on her chest, sometimes the right, sometimes the left, that occur without pattern or provocation. They are not specifically associated with exertion. She hugs herself tight and the sensation eases off. During one episode in July during her daughter's birthday, she took a SL NTG and this eased off. She initially told her daughter symptoms had not recurred since then but she does report they still happen at times. She has a hard time quantifying the last time this happened but says it has been happening about once a week. No specific associated symptoms. This is somewhat similar to when she required PCI. OV 12/2021 also  outlined some periodic chest discomfort. She has not had any this week. Her daughter accompanies her to the visit and assists in the history. 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  Labwork independently reviewed: 03/2022 Hgb 14.1, plt ok, K 4.2, Cr 1.13 CKD3a, TSH wnl 2022 LDL 88, trig 195  ROS: .    Please see the history of present illness.  All other systems are reviewed and otherwise negative.  Studies Reviewed: Marland Kitchen    EKG:  EKG is ordered today, personally reviewed, demonstrating NSR 70bpm, biatrial enlargement, nonspecific STTW changes similar to prior  CV Studies: Cardiac studies reviewed are outlined and summarized above. Otherwise please see EMR for full report.   Current Reported Medications:.    Current Meds  Medication Sig   amLODipine (NORVASC) 5 MG tablet Take 1 tablet (5 mg total) by mouth daily.   aspirin EC 81 MG tablet Take 81 mg by mouth daily.   atorvastatin (LIPITOR) 80 MG tablet TAKE 1 TABLET BY MOUTH EVERY DAY   budesonide-formoterol (SYMBICORT) 160-4.5 MCG/ACT inhaler INHALE 2 PUFFS FIRST THING IN THE MORNING AND THEN ANOTHER 2 PUFFS ABOUT 12 HOURS LATER   losartan (COZAAR) 100 MG tablet TAKE 1 TABLET BY MOUTH EVERY DAY   nitroGLYCERIN (NITROSTAT) 0.4 MG SL tablet PLACE 1 TABLET (0.4 MG TOTAL) UNDER THE TONGUE EVERY 5 (FIVE) MINUTES AS NEEDED FOR CHEST PAIN (UP TO 3 DOSES).    Physical Exam:    VS:  BP 130/72 (  BP Location: Left Arm, Patient Position: Sitting, Cuff Size: Normal)   Pulse 70   Ht 5\' 4"  (1.626 m)   Wt 112 lb 3.2 oz (50.9 kg)   SpO2 98%   BMI 19.26 kg/m    Wt Readings from Last 3 Encounters:  02/22/23 112 lb 3.2 oz (50.9 kg)  09/28/22 112 lb (50.8 kg)  03/29/22 110 lb 9.6 oz (50.2 kg)    GEN: Thin F in no acute distress NECK: No JVD; No carotid bruits CARDIAC: RRR, no murmurs, rubs, gallops RESPIRATORY:  Clear to auscultation without rales, wheezing or rhonchi  ABDOMEN: Soft, non-tender, non-distended EXTREMITIES:  No edema;  No acute deformity   Asessement and Plan:.    1. Chest pain of uncertain etiology, also with chronic dyspnea - mixed features. EKG without acute change from prior. We will trial addition of Imdur 15mg  daily and set up for Lexiscan nuclear stress test and echocardiogram. Lexiscan chosen given COPD. No active wheezing on exam. Lab now closed so will have her return for TSH, CBC, CMET, lipid panel fasting. ER precautions reviewed. She has not been interested in quitting smoking yet.  2. CAD, HLD - continue ASA, atorvastatin, amlodipine, and add Imdur. Get fasting lipid when she returns for labs.   3. HTN - controlled, follow with addition of Imdur.  4. Mild ascending aortic dilation - due for repeat assessment, will pursue CTA aorta w/ w/o CM. This will also help [place] evaluate lung fields given her long tobacco hx to ensure no new concerning lesions. They are already aware of the incidental adrenal adenoma. Discussed having  them talk with PCP re: whether any surveillance needed for this.    Disposition: F/u with APP in 3-4 weeks. Keep late Oct 2024 appt with Dr. Diona Browner.  Signed, Laurann Montana, PA-C

## 2023-03-27 ENCOUNTER — Telehealth (HOSPITAL_COMMUNITY): Payer: Self-pay | Admitting: Emergency Medicine

## 2023-03-27 ENCOUNTER — Other Ambulatory Visit: Payer: Medicare Other

## 2023-03-27 NOTE — Telephone Encounter (Signed)
Called twice, not able to leave a message regarding Lexiscan.

## 2023-03-28 ENCOUNTER — Encounter (HOSPITAL_COMMUNITY): Payer: Self-pay

## 2023-03-28 ENCOUNTER — Ambulatory Visit (HOSPITAL_COMMUNITY)
Admission: RE | Admit: 2023-03-28 | Discharge: 2023-03-28 | Disposition: A | Discharge status: Home / Self Care | Payer: Medicare Other | Source: Ambulatory Visit | Attending: Cardiology | Admitting: Cardiology

## 2023-03-28 ENCOUNTER — Encounter (HOSPITAL_BASED_OUTPATIENT_CLINIC_OR_DEPARTMENT_OTHER)
Admission: RE | Admit: 2023-03-28 | Discharge: 2023-03-28 | Disposition: A | Discharge status: Home / Self Care | Payer: Medicare Other | Source: Ambulatory Visit | Attending: Physician Assistant | Admitting: Physician Assistant

## 2023-03-28 ENCOUNTER — Ambulatory Visit (HOSPITAL_COMMUNITY)
Admission: RE | Admit: 2023-03-28 | Discharge: 2023-03-28 | Disposition: A | Discharge status: Home / Self Care | Payer: Medicare Other | Source: Ambulatory Visit | Attending: Physician Assistant | Admitting: Physician Assistant

## 2023-03-28 DIAGNOSIS — R079 Chest pain, unspecified: Secondary | ICD-10-CM | POA: Insufficient documentation

## 2023-03-28 LAB — NM MYOCAR MULTI W/SPECT W/WALL MOTION / EF
Base ST Depression (mm): 0 mm
LV dias vol: 62 mL (ref 46–106)
LV sys vol: 23 mL
Nuc Stress EF: 64 %
Peak HR: 105 {beats}/min
RATE: 0.3
Rest HR: 63 {beats}/min
Rest Nuclear Isotope Dose: 11 mCi
SDS: 1
SRS: 0
SSS: 1
ST Depression (mm): 0 mm
Stress Nuclear Isotope Dose: 32 mCi
TID: 1.29

## 2023-03-28 LAB — ECHOCARDIOGRAM COMPLETE
Area-P 1/2: 1.96 cm2
S' Lateral: 2.1 cm

## 2023-03-28 MED ORDER — TECHNETIUM TC 99M TETROFOSMIN IV KIT
30.0000 | PACK | Freq: Once | INTRAVENOUS | Status: AC | PRN
  Administered 2023-03-28: 32 via INTRAVENOUS

## 2023-03-28 MED ORDER — TECHNETIUM TC 99M TETROFOSMIN IV KIT
10.0000 | PACK | Freq: Once | INTRAVENOUS | Status: AC | PRN
  Administered 2023-03-28: 11 via INTRAVENOUS

## 2023-03-28 MED ORDER — SODIUM CHLORIDE FLUSH 0.9 % IV SOLN
INTRAVENOUS | Status: AC
  Administered 2023-03-28: 10 mL via INTRAVENOUS
  Filled 2023-03-28: qty 10

## 2023-03-28 MED ORDER — REGADENOSON 0.4 MG/5ML IV SOLN
INTRAVENOUS | Status: AC
  Administered 2023-03-28: 0.4 mg via INTRAVENOUS
  Filled 2023-03-28: qty 5

## 2023-03-28 NOTE — Progress Notes (Signed)
*  PRELIMINARY RESULTS* Echocardiogram 2D Echocardiogram has been performed.  [name] 03/28/2023, 10:45 AM

## 2023-04-02 ENCOUNTER — Ambulatory Visit (HOSPITAL_COMMUNITY)
Admission: RE | Admit: 2023-04-02 | Discharge: 2023-04-02 | Disposition: A | Discharge status: Home / Self Care | Payer: Medicare Other | Source: Ambulatory Visit | Attending: Physician Assistant | Admitting: Physician Assistant

## 2023-04-02 ENCOUNTER — Encounter (HOSPITAL_COMMUNITY): Payer: Self-pay | Admitting: Radiology

## 2023-04-02 DIAGNOSIS — I77810 Thoracic aortic ectasia: Secondary | ICD-10-CM

## 2023-04-02 MED ORDER — IOHEXOL 350 MG/ML SOLN
75.0000 mL | Freq: Once | INTRAVENOUS | Status: AC | PRN
  Administered 2023-04-02: 75 mL via INTRAVENOUS

## 2023-04-04 ENCOUNTER — Ambulatory Visit: Payer: Medicare Other | Admitting: Cardiology

## 2023-04-08 ENCOUNTER — Telehealth: Payer: Self-pay | Admitting: Cardiology

## 2023-04-08 NOTE — Telephone Encounter (Signed)
Daughter returned RN's call regarding results.

## 2023-04-09 NOTE — Telephone Encounter (Signed)
Laurann Montana, PA-C  P [facility] Triage FYI pt's daughter is an NP and came to last cardiology visit. Please let pt know that CTA thankfully did not show any evidence of any aneurysm of the aorta. There was emphysema, coronary and aortic plaque which we already knew about. The study does comment on a "partially calcified left-sided thyroid nodule/mass replacing near the entirety of the left lobe of the thyroid." I would recommend she discuss this further with her PCP - pulmonary notes in 2021 outline potential thyroid abnormality back on CXR in 2021 and Dr. Thurston Hole note stated that she was aware of the abnormality, but recommend she discuss updating testing with PCP.  I also see she has since been in ED in Sept for syncope with reassuring workup. Would proceed with 14 day live Zio for syncope if she is agreeable - if she would rather discuss with Dr. Diona Browner, she already has a f/u scheduled 10/28. She did not return for fasting labs as previously recommended but had most of it done in ER - just missing fasting lipid panel. Can obtain at next convenience.      I discussed results with daughter and will copy pcp. I told daughter we can apply monitor at office visit.

## 2023-04-15 ENCOUNTER — Other Ambulatory Visit: Payer: Self-pay | Admitting: Cardiology

## 2023-04-15 ENCOUNTER — Telehealth: Payer: Self-pay | Admitting: Cardiology

## 2023-04-15 ENCOUNTER — Encounter: Payer: Self-pay | Admitting: Cardiology

## 2023-04-15 ENCOUNTER — Ambulatory Visit: Payer: Medicare Other | Attending: Cardiology

## 2023-04-15 ENCOUNTER — Ambulatory Visit: Payer: Medicare Other | Attending: Cardiology | Admitting: Cardiology

## 2023-04-15 VITALS — BP 128/72 | HR 60 | Ht 64.0 in | Wt 122.6 lb

## 2023-04-15 DIAGNOSIS — I10 Essential (primary) hypertension: Secondary | ICD-10-CM | POA: Diagnosis not present

## 2023-04-15 DIAGNOSIS — R55 Syncope and collapse: Secondary | ICD-10-CM

## 2023-04-15 DIAGNOSIS — I25119 Atherosclerotic heart disease of native coronary artery with unspecified angina pectoris: Secondary | ICD-10-CM | POA: Insufficient documentation

## 2023-04-15 DIAGNOSIS — F17201 Nicotine dependence, unspecified, in remission: Secondary | ICD-10-CM | POA: Diagnosis present

## 2023-04-15 DIAGNOSIS — E782 Mixed hyperlipidemia: Secondary | ICD-10-CM | POA: Insufficient documentation

## 2023-04-15 DIAGNOSIS — Z87898 Personal history of other specified conditions: Secondary | ICD-10-CM | POA: Insufficient documentation

## 2023-04-15 NOTE — Telephone Encounter (Signed)
14 DAY MONITOR  checking percert

## 2023-04-15 NOTE — Patient Instructions (Addendum)
Medication Instructions:  Your physician recommends that you continue on your current medications as directed. Please refer to the Current Medication list given to you today.  Labwork: none  Testing/Procedures: ZIO XT heart monitor for 14 days  Follow-Up: Your physician recommends that you schedule a follow-up appointment in: 6 months  Any Other Special Instructions Will Be Listed Below (If Applicable).  If you need a refill on your cardiac medications before your next appointment, please call your pharmacy.

## 2023-04-15 NOTE — Progress Notes (Signed)
Cardiology Office Note  Date: 04/15/2023   ID: Raphael Coppin, DOB 10-Jul-1950, MRN [id number]  History of Present Illness: Melissa Mccall is a 72 y.o. female last seen in September by Ms. Dunn PA-C, I reviewed the note.  She was seen for evaluation of chest discomfort.  At that time started on Imdur and referred for follow-up cardiac structural and ischemic testing as reviewed below, overall reassuring findings.    She was also soon thereafter seen at [facility] for evaluation of an episode of apparent syncope.  She states that she had been having some headaches, presumably after starting Imdur.  Was seated at the time, felt nauseated, things "went blank" and she was observed to have syncope by a friend present.  After few minutes she was able to get up, went to the bathroom and had actually soiled herself.  She drove herself home, her daughter (an NP) checked on her there and found her to be somewhat orthostatic at the time.  She had apparently not been drinking fluids regularly either.  On evaluation in the ER, there were no acute findings.  Chest x-ray and head CT negative.  Sounds like this may have been a vasovagal event.  Since then she has stopped smoking, has has otherwise tolerated Imdur without headaches, and reports no dizziness or increasing chest pain.  Importantly, chest CTA done in the interim shows no evidence of aortic aneurysm or dissection.  Her PCP recommended that she consider a cardiac monitor. She was incidentally noted to have at least 4.4 cm partially calcified left sided thyroid nodule/mass with further imaging recommended.  I reviewed her medications.  Current cardiac regimen includes aspirin, Norvasc, Lipitor, Imdur, Cozaar, and as needed nitroglycerin.  Today we discussed the results of her cardiac testing and chest CTA as reviewed below.  Physical Exam: VS:  BP 128/72 (BP Location: Right Arm)   Pulse 60   Ht 5\' 4"  (1.626 m)   Wt 122 lb 9.6 oz (55.6 kg)    SpO2 97%   BMI 21.04 kg/m , BMI Body mass index is 21.04 kg/m.  Wt Readings from Last 3 Encounters:  04/15/23 122 lb 9.6 oz (55.6 kg)  02/22/23 112 lb 3.2 oz (50.9 kg)  09/28/22 112 lb (50.8 kg)    General: Patient appears comfortable at rest. HEENT: Conjunctiva and lids normal. Neck: Supple, no elevated JVP or carotid bruits, fullness left neck. Lungs: Clear to auscultation, nonlabored breathing at rest. Cardiac: Regular rate and rhythm, no S3 or significant systolic murmur, no pericardial rub. Extremities: No pitting edema.  ECG:  An ECG dated 02/22/2023 was personally reviewed today and demonstrated:  Sinus rhythm with possible biatrial enlargement and nonspecific ST-T changes.  Labwork:  October 2023: Cholesterol 212, triglycerides 77, HDL 94, LDL 102 September 2024: High-sensitivity troponin I 7, UDS positive for cannabinoids, TSH 0.988, hemoglobin 13, platelets 210, potassium 4.4, BUN 24, creatinine 1.09, AST 26, ALT 29  Other Studies Reviewed Today:  Echocardiogram 03/28/2023:  1. Left ventricular ejection fraction, by estimation, is 60 to 65%. The  left ventricle has normal function. The left ventricle has no regional  wall motion abnormalities. There is mild asymmetric left ventricular  hypertrophy of the septal segment. Left  ventricular diastolic parameters are consistent with Grade I diastolic  dysfunction (impaired relaxation).   2. Right ventricular systolic function is normal. The right ventricular  size is normal. Tricuspid regurgitation signal is inadequate for assessing  PA pressure.   3. The mitral valve is  normal in structure. No evidence of mitral valve  regurgitation. No evidence of mitral stenosis.   4. The aortic valve was not well visualized. Aortic valve regurgitation  is not visualized. No aortic stenosis is present.   5. The inferior vena cava is normal in size with greater than 50%  respiratory variability, suggesting right atrial pressure of 3  mmHg.   Lexiscan Myoview 03/28/2023:   Stress ECG is negative for ischemia and arrhythmias.   LV perfusion is normal. There is no evidence of ischemia. There is no evidence of infarction.   Left ventricular function is normal. Nuclear stress EF: 64%.   Findings are consistent with no ischemia. The study is low risk.  Chest CTA 04/02/2023: IMPRESSION: 1. No acute cardiopulmonary disease. Specifically, no evidence of thoracic aortic aneurysm or dissection on this nongated examination. 2. Coronary artery calcifications. Aortic Atherosclerosis (ICD10-I70.0). 3. Emphysema (ICD10-J43.9). 4. At least 4.4 cm partially calcified left-sided thyroid nodule/mass replacing near the entirety of the left lobe of the thyroid. Further evaluation with thyroid ultrasound could be performed as indicated.  Assessment and Plan:  1.  CAD status post BMS to the proximal LAD in 2013 with DES to the OM 2 in 2022.  She reports no recurring chest pain at this time and is tolerating low-dose Imdur.  Lexiscan Myoview and echocardiogram done recently were overall reassuring.  No active ischemia and LVEF normal range.  Otherwise we will continue aspirin, Norvasc, Lipitor, and as needed nitroglycerin.   2.  Primary hypertension.  She is also on Cozaar in addition to the above.  No changes made today.   3.  Mixed hyperlipidemia.  Continue high-dose Lipitor.   4.  Recent chest CTA showing no evidence of thoracic aortic aneurysm or dissection.  5.  Partially calcified 4.4 cm left sided thyroid nodule/mass by recent CT imaging.  We discussed this today, patient has visit to see PCP and discuss further imaging, thyroid ultrasound was recommended.  She may ultimately need a biopsy.  6.  Tobacco abuse in remission.  7.  History of syncope as discussed above.  Sounds potentially vasovagal based on description.  Cardiac monitor requested per PCP discussion.  Will place a 14-day ZIO monitor although do not necessarily  suspect arrhythmia as contributor.  Disposition:  Follow up  6 months.  Signed, Jonelle Sidle, M.D., F.A.C.C. [facility] at [facility]

## 2023-06-03 ENCOUNTER — Other Ambulatory Visit: Payer: Self-pay | Admitting: Student

## 2023-06-26 ENCOUNTER — Other Ambulatory Visit (HOSPITAL_COMMUNITY): Payer: Self-pay | Admitting: Family Medicine

## 2023-06-26 DIAGNOSIS — E041 Nontoxic single thyroid nodule: Secondary | ICD-10-CM

## 2023-07-27 ENCOUNTER — Other Ambulatory Visit: Payer: Self-pay | Admitting: Cardiology

## 2023-09-02 ENCOUNTER — Other Ambulatory Visit: Payer: Self-pay | Admitting: Cardiology

## 2023-12-26 ENCOUNTER — Other Ambulatory Visit: Payer: Self-pay | Admitting: Physician Assistant

## 2023-12-26 DIAGNOSIS — R079 Chest pain, unspecified: Secondary | ICD-10-CM

## 2024-01-26 ENCOUNTER — Other Ambulatory Visit: Payer: Self-pay | Admitting: Cardiology

## 2024-03-26 NOTE — Telephone Encounter (Signed)
 Monitor status in ZIO Suite is Lost.

## 2024-03-28 ENCOUNTER — Other Ambulatory Visit: Payer: Self-pay | Admitting: Cardiology

## 2024-03-28 DIAGNOSIS — R079 Chest pain, unspecified: Secondary | ICD-10-CM

## 2024-03-31 ENCOUNTER — Ambulatory Visit: Attending: Cardiology | Admitting: Cardiology

## 2024-03-31 ENCOUNTER — Encounter: Payer: Self-pay | Admitting: Cardiology

## 2024-03-31 VITALS — BP 132/86 | HR 80 | Ht 64.0 in | Wt 119.0 lb

## 2024-03-31 DIAGNOSIS — I10 Essential (primary) hypertension: Secondary | ICD-10-CM | POA: Insufficient documentation

## 2024-03-31 DIAGNOSIS — Z720 Tobacco use: Secondary | ICD-10-CM | POA: Diagnosis present

## 2024-03-31 DIAGNOSIS — I25119 Atherosclerotic heart disease of native coronary artery with unspecified angina pectoris: Secondary | ICD-10-CM | POA: Insufficient documentation

## 2024-03-31 DIAGNOSIS — E782 Mixed hyperlipidemia: Secondary | ICD-10-CM | POA: Insufficient documentation

## 2024-03-31 MED ORDER — EZETIMIBE 10 MG PO TABS: 10.0000 mg | ORAL_TABLET | Freq: Every day | ORAL | 3 refills | Status: AC

## 2024-03-31 NOTE — Progress Notes (Signed)
    Cardiology Office Note  Date: 03/31/2024   ID: Ruhee Enck, DOB 1951-02-26, MRN [id number]  History of Present Illness: Melissa Mccall is a 73 y.o. female last seen October 2024.  She is here today with her daughter for a follow-up visit.  Reports apparent URI about a month ago with atypical thoracic pain, possibly pleuritic.  Was seen for evaluation at [facility], no evidence of ACS.  She has started smoking since I last saw her, we did discuss smoking cessation again today.  No increasing nitroglycerin  use.  No frank syncope.  She is planning on ENT evaluation for assessment of thyroid  nodule.  We went over her medications.  She reports compliance with current regimen.  I also went over her lab work from July.  LDL was 89 at that time, HDL 106.  Discussed addition of Zetia 10 mg daily to her standing dose of Lipitor.  I reviewed her ECG today which shows normal sinus rhythm with biatrial enlargement.  Physical Exam: VS:  BP 132/86   Pulse 80   Ht 5' 4 (1.626 m)   Wt 119 lb (54 kg)   SpO2 96%   BMI 20.43 kg/m , BMI Body mass index is 20.43 kg/m.  Wt Readings from Last 3 Encounters:  03/31/24 119 lb (54 kg)  04/15/23 122 lb 9.6 oz (55.6 kg)  02/22/23 112 lb 3.2 oz (50.9 kg)    General: Patient appears comfortable at rest. HEENT: Conjunctiva and lids normal. Neck: Supple, no elevated JVP or carotid bruits. Lungs: Clear to auscultation, nonlabored breathing at rest. Cardiac: Regular rate and rhythm, no S3 or significant systolic murmur.  ECG:  An ECG dated 02/22/2023 was personally reviewed today and demonstrated:  Sinus rhythm with possible biatrial enlargement and nonspecific ST-T changes.  Labwork:  July 2025: Hemoglobin 14.7, platelets 214, potassium 4.4, BUN 18, creatinine 1.01, GFR 58, AST 27, ALT 23, cholesterol 206, triglycerides 55, HDL 106, LDL 89, TSH 1.374  Other Studies Reviewed Today:  No interval cardiac testing for review today.  Assessment and  Plan:  1.  CAD status post BMS to the proximal LAD in 2013 with DES to the OM 2 in 2022.  Lexiscan  Myoview  in October 2024 showed no evidence of ischemia and LVEF 60 to 65% by echocardiogram at that time.  No obvious accelerating angina or increasing nitroglycerin  use.  ECG reviewed and stable.  Continue aspirin  81 mg daily, Lipitor 80 mg daily, Imdur  15 mg daily, and as needed nitroglycerin .  2.  Primary hypertension.  Continue Norvasc  5 mg daily and Cozaar  100 mg daily.   2.  Mixed hyperlipidemia.  LDL 89 and HDL 106 in July.  Continue Lipitor 80 mg daily and and Zetia 10 mg daily.  3.  Tobacco abuse.  Started smoking again since last visit.  Has been a challenge to quit long-term.  We discussed smoking cessation again today.   4.  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  Disposition:  Follow up 1 year.  Signed, Jayson JUDITHANN Sierras, M.D., F.A.C.C. [facility]

## 2024-03-31 NOTE — Patient Instructions (Addendum)
 Medication Instructions:  Your physician has recommended you make the following change in your medication:  Start zetia 10 mg daily Continue all other medications as prescribed  Labwork: none  Testing/Procedures: none  Follow-Up: Your physician recommends that you schedule a follow-up appointment in: 1 year. You will receive a reminder call in about 8-10 months reminding you to schedule your appointment. If you don't receive this call, please contact our office.  Any Other Special Instructions Will Be Listed Below (If Applicable).  If you need a refill on your cardiac medications before your next appointment, please call your pharmacy.

## 2024-05-19 ENCOUNTER — Other Ambulatory Visit: Payer: Self-pay | Admitting: Cardiology

## 2024-05-25 ENCOUNTER — Ambulatory Visit: Admitting: Cardiology

## 2024-06-25 ENCOUNTER — Encounter: Payer: Self-pay | Admitting: *Deleted

## 2024-06-29 ENCOUNTER — Telehealth (HOSPITAL_BASED_OUTPATIENT_CLINIC_OR_DEPARTMENT_OTHER): Payer: Self-pay

## 2024-06-29 NOTE — Telephone Encounter (Signed)
"  ° °  Patient Name: Melissa Mccall  DOB: 1950/11/27 MRN: [id number]  Primary Cardiologist: Jayson Sierras, MD  Chart reviewed as part of pre-operative protocol coverage. Given past medical history and time since last visit, based on ACC/AHA guidelines, Kaylea Deal is at acceptable risk for the planned procedure without further cardiovascular testing.   If the patient develops new or concerning symptoms prior to the procedure, she should contact our office to arrange for a follow-up visit.  Regarding ASA therapy, we recommend continuation of ASA throughout the perioperative period.  However, if the surgeon feels that cessation of ASA is required in the perioperative period, it may be stopped 5-7 days prior to surgery with a plan to resume it as soon as felt to be feasible from a surgical standpoint in the post-operative period.  SBE prophylaxis is not required.  I will route this recommendation to the requesting party via Epic fax function and remove from pre-op pool.  Please call with questions.  Saddie GORMAN Cleaves, NP 06/29/2024, 10:57 AM  "

## 2024-06-29 NOTE — Telephone Encounter (Signed)
"  ° °  Pre-operative Risk Assessment    Patient Name: Melissa Mccall  DOB: 1950-07-01 MRN: [id number]   Date of last office visit: 03/31/24 with Debera   Date of next office visit: NA  Request for Surgical Clearance    Procedure:  Dental Extraction - Amount of Teeth to be Pulled:  7 simple and 2 surgical (completed over 2-3 visits)  Date of Surgery:  Clearance TBD                                 Surgeon:  not inidcated Surgeon's Group or Practice Name:  [facility]  Phone number:  [phone] Fax number:  [phone]   Type of Clearance Requested:   - Medical  - Pharmacy:  Hold Aspirin  not indicated    Type of Anesthesia:  Local with epi   Additional requests/questions:    SignedAugustin JONETTA Daring   06/29/2024, 10:38 AM   "

## 2024-07-04 ENCOUNTER — Other Ambulatory Visit: Payer: Self-pay | Admitting: Cardiology

## 2024-07-04 DIAGNOSIS — R079 Chest pain, unspecified: Secondary | ICD-10-CM
